# Patient Record
Sex: Female | Born: 1955 | Race: White | Hispanic: No | Marital: Married | Smoking: Never smoker
Health system: Southern US, Community
[De-identification: ages and names within clinical notes are randomized; demographics above are authoritative.]

## PROBLEM LIST (undated history)

## (undated) DIAGNOSIS — E039 Hypothyroidism, unspecified: Secondary | ICD-10-CM

## (undated) HISTORY — DX: Hypothyroidism, unspecified: E03.9

---

## 2007-10-09 IMAGING — CT CT ABD-PELV W/O CM
3 of 9 series · 11 of 42 positions shown, 17 images · non-contrast
Comparison: NONE

CLINICAL DATA: Severe lower abdominal pain off and on times 1 
week.  

CT ABDOMEN AND PELVIS WITHOUT AND WITH INTRAVENOUS AND FOLLOWING 
ORAL  CONTRAST
TECHNIQUE: Multiple axial 5.0-mm-thick slices at 5.0-mm 
intervals were obtained from the lung base through the pelvis 
following the intravenous administration of 96 cc of Optiray 350 
at a rate of 3 cc per second.  Oral contrast was administered as 
well.  Arterial and venous phase imaging was obtained in the upper 
abdomen with delayed images obtained through the pelvis.

[Series 4: venous · axial · portal-venous · 0.68mm/px · z∈[-176,+94]mm · 4 of 90 slices shown, 9 images]
[im 18/90  soft-tissue]
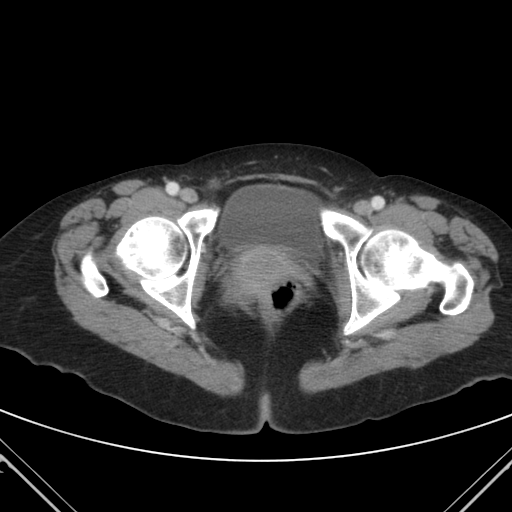
[im 18/90  lung]
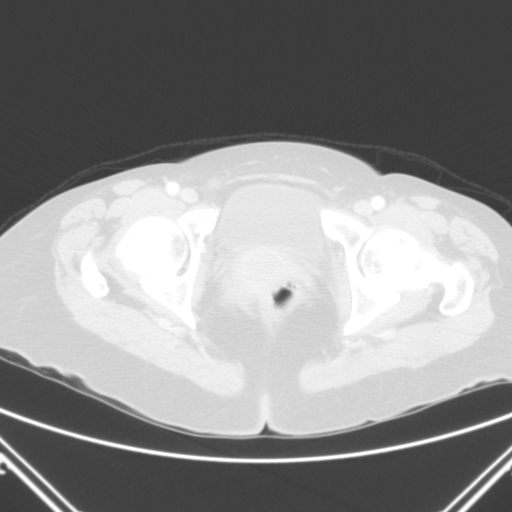
[im 18/90  bone]
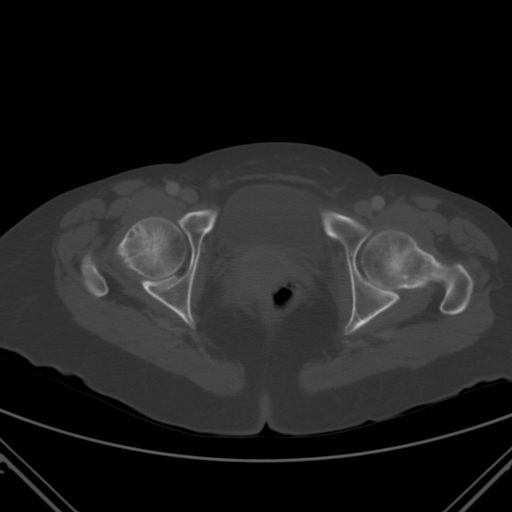
[im 36/90  soft-tissue]
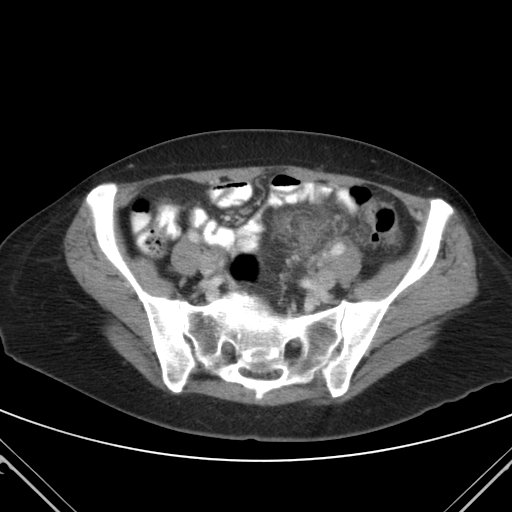
[im 36/90  lung]
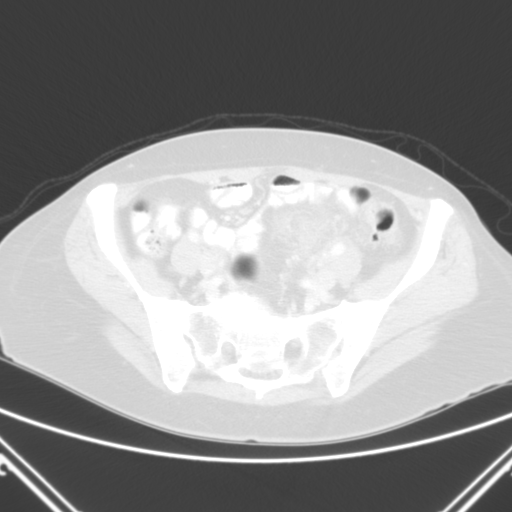
[im 54/90  soft-tissue]
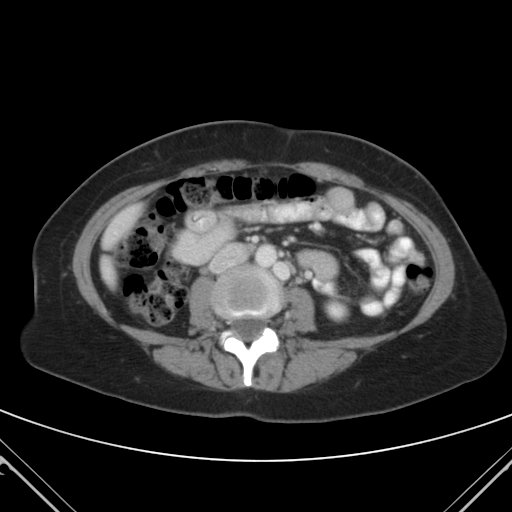
[im 54/90  lung]
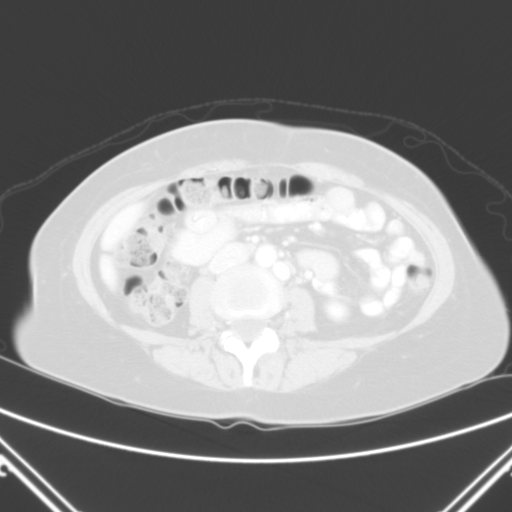
[im 72/90  soft-tissue]
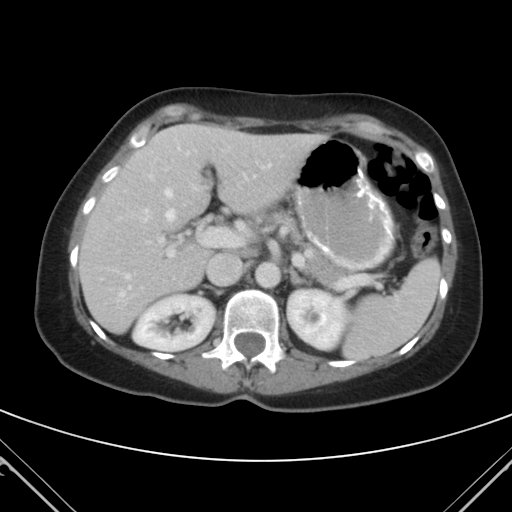
[im 72/90  lung]
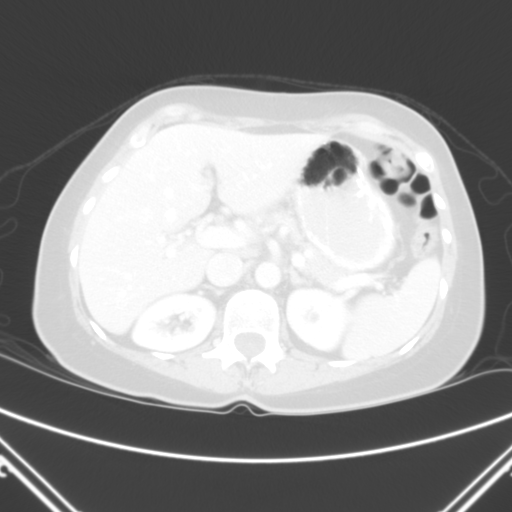

[measurment · axial · 0.68mm/px · z∈[-176,+94]mm · 4 of 90 slices shown]
[im 18/90  soft-tissue]
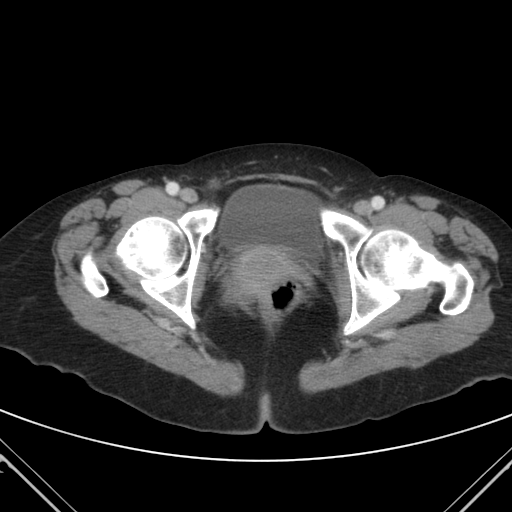
[im 36/90  soft-tissue]
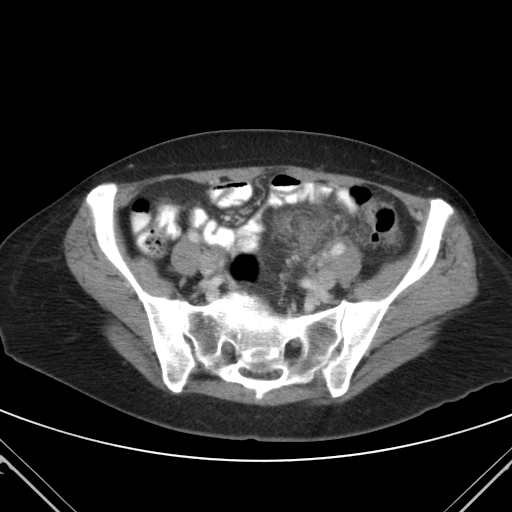
[im 54/90  soft-tissue]
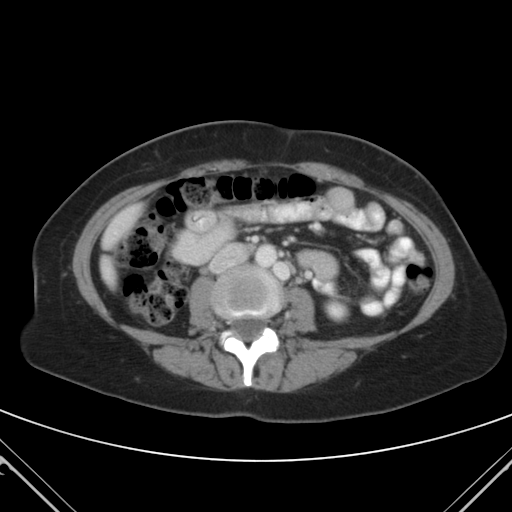
[im 72/90  soft-tissue]
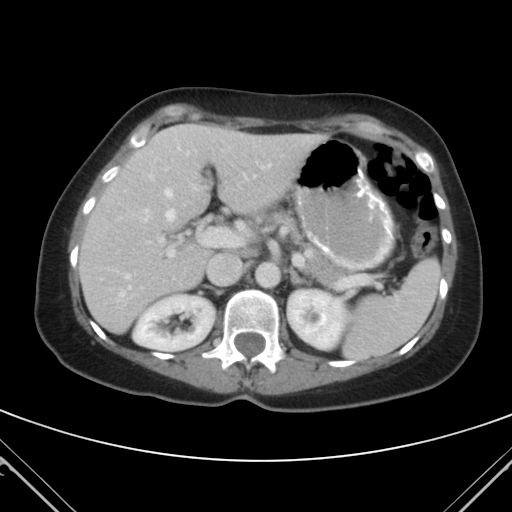

[coronals · coronal · 0.87mm/px · 3 of 58 slices shown, 4 images]
[im 20/58  soft-tissue]
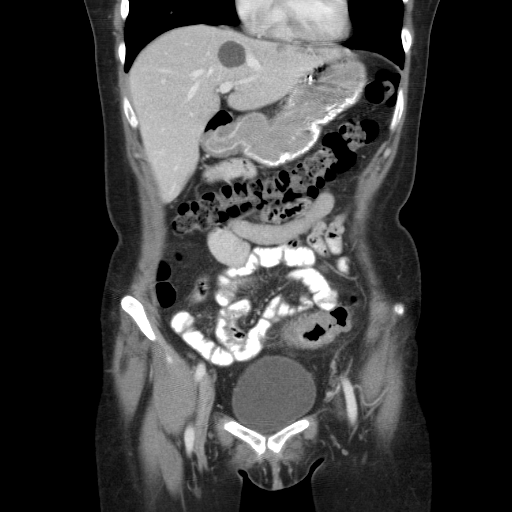
[im 26/58  soft-tissue]
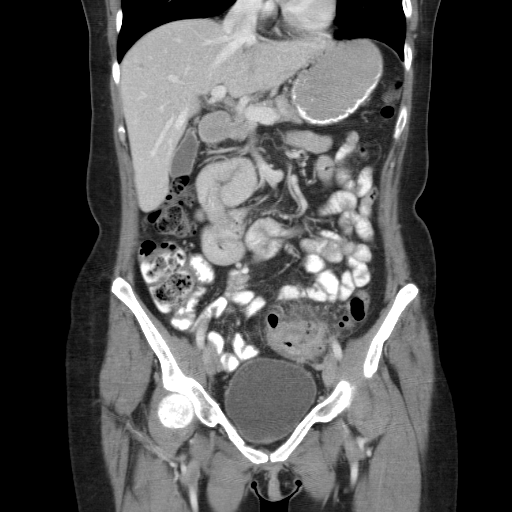
[im 26/58  bone]
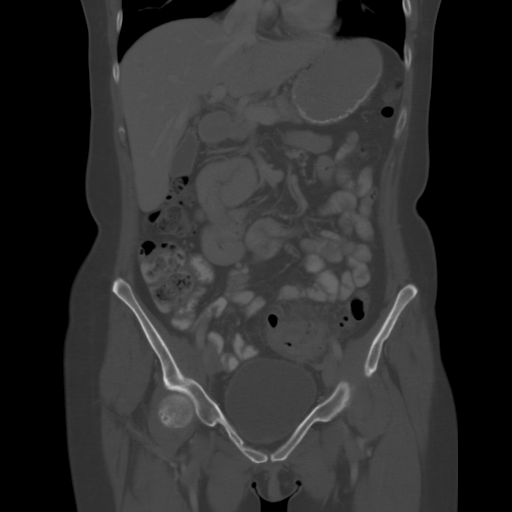
[im 32/58  soft-tissue]
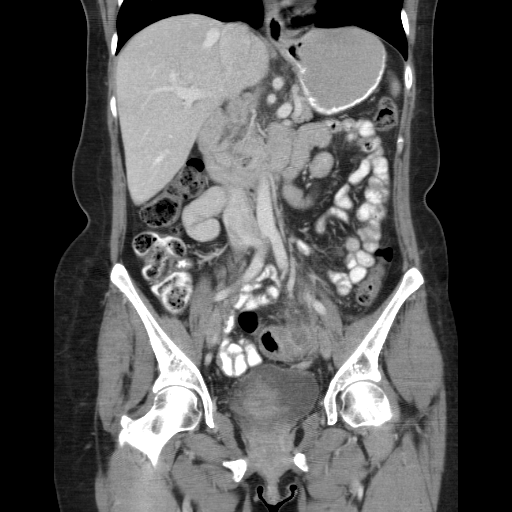

[11 of 42 positions shown; findings below may reference images not displayed]

FINDINGS: Scout image shows normal bowel gas pattern. The initial 
noncontrast images show a well-circumscribed, low-attenuation 
lesion in the dome of the right lobe of the liver, most likely due 
to a liver cyst.  Oral contrast is noted within the gastric lumen. 
 The spleen, pancreas, and gallbladder are normal.  Left kidney is 
normal.  Patient has some prominence of the right renal pelvis.  
Ureters are normal in caliber. Following IV contrast 
administration, the lesion in the right lobe of the liver does not 
enhance and a second tiny lesion in the posterior right lobe of 
the liver is also noted, consistent with additional liver cyst.  
Pancreas enhances normally.  Renal parenchyma enhances well 
bilaterally.  Patient has a retroaortic draining left renal vein, 
which is a normal vascular variant. On the delayed post-contrast 
images, several small renal cortical cysts are noted on the left. 
Pericecal structures are normal. Patient has abnormal wall 
thickening and pericolonic stranding and inflammatory changes 
adjacent to the sigmoid colon, worrisome for acute diverticulitis. 
Patient has a heterogeneous enhancement pattern of the uterine 
myometrium.  Urinary bladder contour is smooth.  There is some 
free fluid in the right cul-de-sac.  Small cyst or follicle is 
noted in the left ovary.  Lung bases are clear.
IMPRESSION: Changes of diverticulitis sigmoid colon with small 
collection of fluid in the right cul-de-sac of the pelvis.  
Patient has benign cysts within the liver and left kidney. This 
report was called to [REDACTED] on 10/29/2006. 
Klever Jumper, M.D. Electronically reviewed on 10/29/2006 Dict 
Date: 10/29/2006  Trans Date: 10/29/2006 [REDACTED]

## 2016-01-06 ENCOUNTER — Other Ambulatory Visit (HOSPITAL_COMMUNITY)
Admission: RE | Admit: 2016-01-06 | Discharge: 2016-01-06 | Disposition: A | Discharge status: Home / Self Care | Payer: 59 | Source: Ambulatory Visit | Attending: Family Medicine | Admitting: Family Medicine

## 2016-01-06 DIAGNOSIS — Z1151 Encounter for screening for human papillomavirus (HPV): Secondary | ICD-10-CM | POA: Diagnosis not present

## 2016-01-06 DIAGNOSIS — Z01411 Encounter for gynecological examination (general) (routine) with abnormal findings: Secondary | ICD-10-CM | POA: Diagnosis present

## 2016-01-08 ENCOUNTER — Other Ambulatory Visit: Payer: Self-pay | Admitting: Family Medicine

## 2016-01-14 LAB — CYTOLOGY - PAP
Diagnosis: NEGATIVE
HPV: NOT DETECTED

## 2016-01-24 ENCOUNTER — Other Ambulatory Visit: Payer: Self-pay | Admitting: Family Medicine

## 2016-01-24 DIAGNOSIS — R7989 Other specified abnormal findings of blood chemistry: Secondary | ICD-10-CM

## 2016-01-30 ENCOUNTER — Ambulatory Visit
Admission: RE | Admit: 2016-01-30 | Discharge: 2016-01-30 | Disposition: A | Discharge status: Home / Self Care | Payer: 59 | Source: Ambulatory Visit | Attending: Family Medicine | Admitting: Family Medicine

## 2016-01-30 DIAGNOSIS — R7989 Other specified abnormal findings of blood chemistry: Secondary | ICD-10-CM

## 2016-12-29 ENCOUNTER — Other Ambulatory Visit: Payer: Self-pay | Admitting: Family Medicine

## 2016-12-29 DIAGNOSIS — E039 Hypothyroidism, unspecified: Secondary | ICD-10-CM

## 2017-01-05 ENCOUNTER — Ambulatory Visit
Admission: RE | Admit: 2017-01-05 | Discharge: 2017-01-05 | Disposition: A | Discharge status: Home / Self Care | Payer: BLUE CROSS/BLUE SHIELD | Source: Ambulatory Visit | Attending: Family Medicine | Admitting: Family Medicine

## 2017-01-05 DIAGNOSIS — E039 Hypothyroidism, unspecified: Secondary | ICD-10-CM

## 2017-01-09 IMAGING — US US THYROID
1 series · 13 of 25 positions shown · non-contrast
Comparison: None.

CLINICAL DATA: Abnormal TSH by thyroid function tests

EXAM:
THYROID ULTRASOUND
TECHNIQUE: Ultrasound examination of the thyroid gland and adjacent soft
tissues was performed.

[Series 1: us thyroid · 0.08mm/px · 13 of 61 slices shown]
[im 1/61]
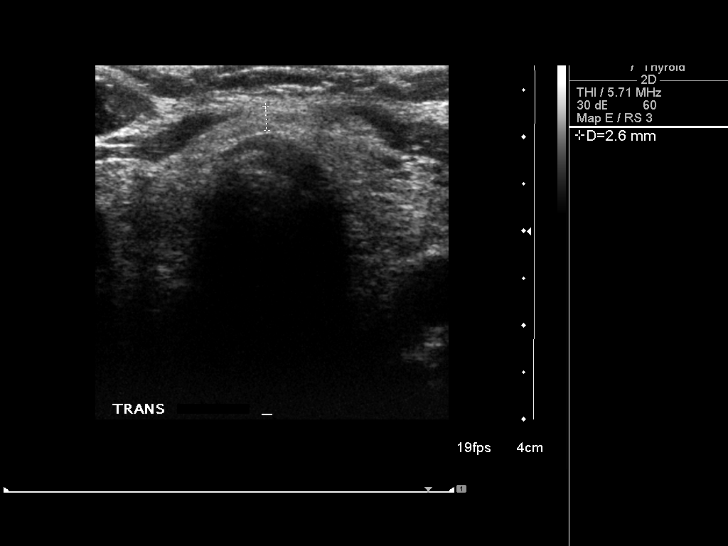
[im 6/61]
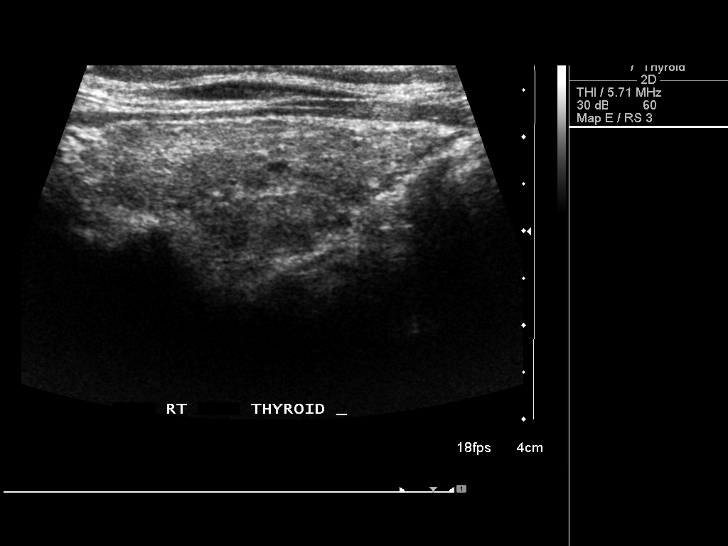
[im 11/61]
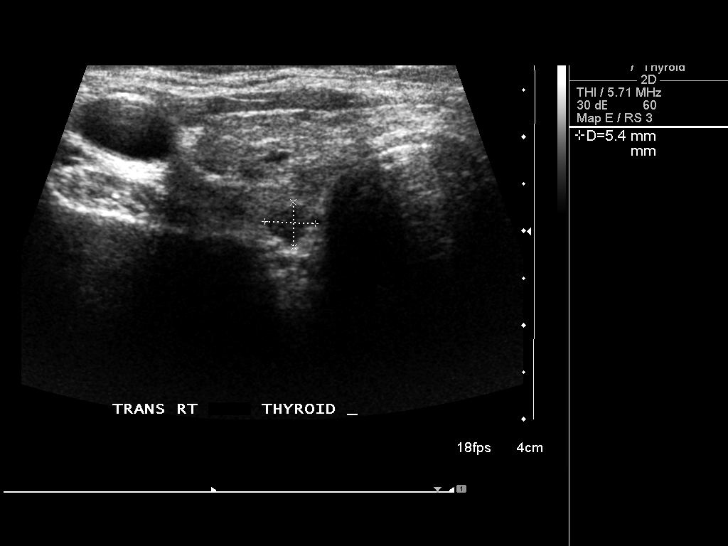
[im 16/61]
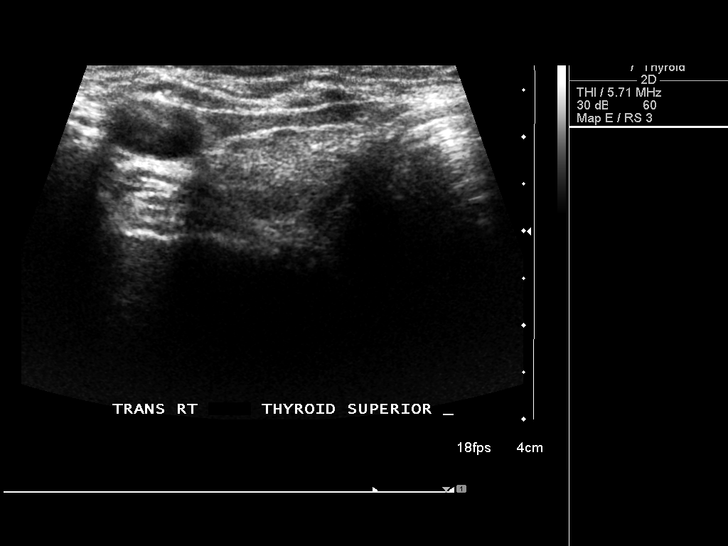
[im 21/61]
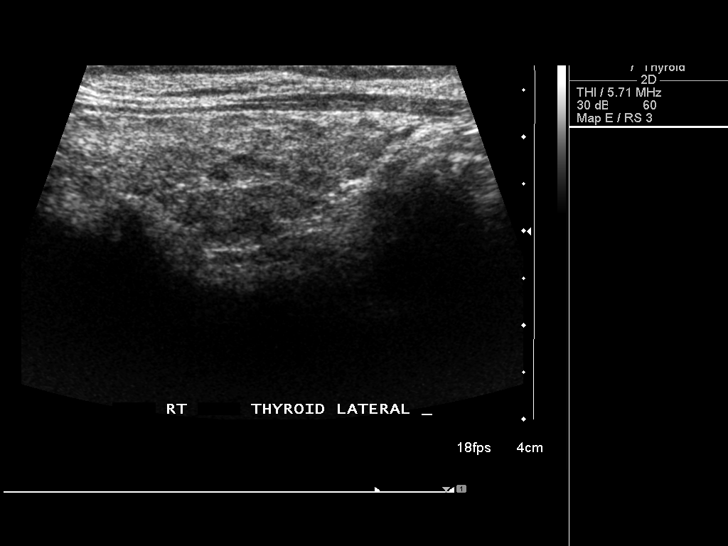
[im 26/61]
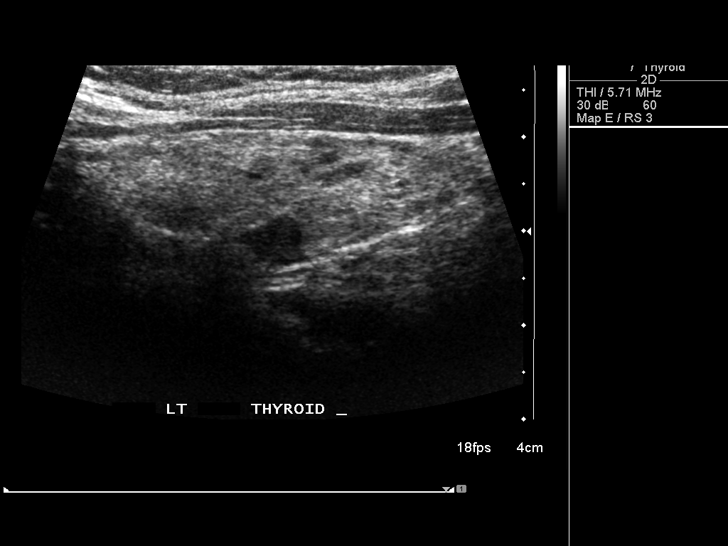
[im 31/61]
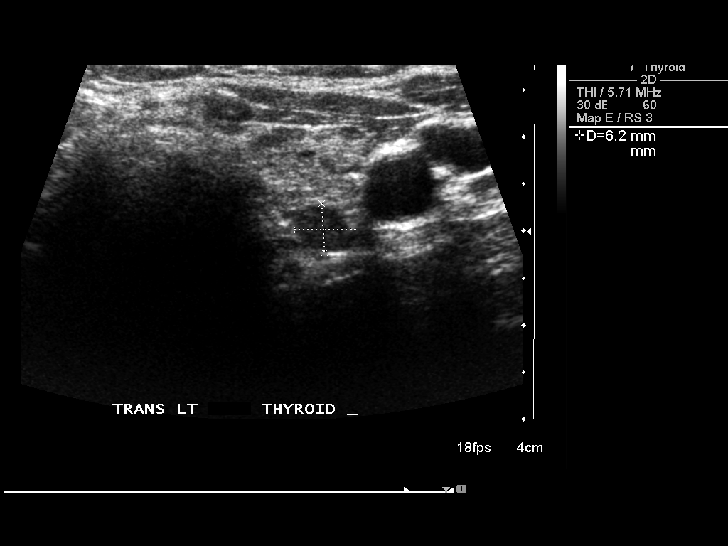
[im 36/61]
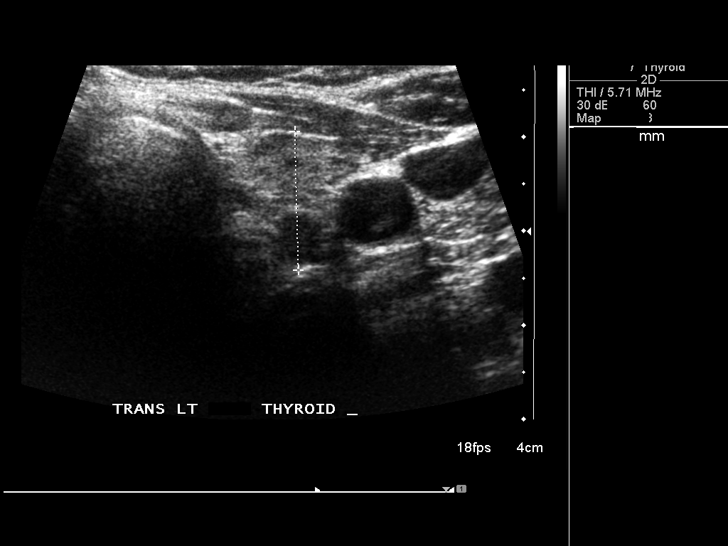
[im 41/61]
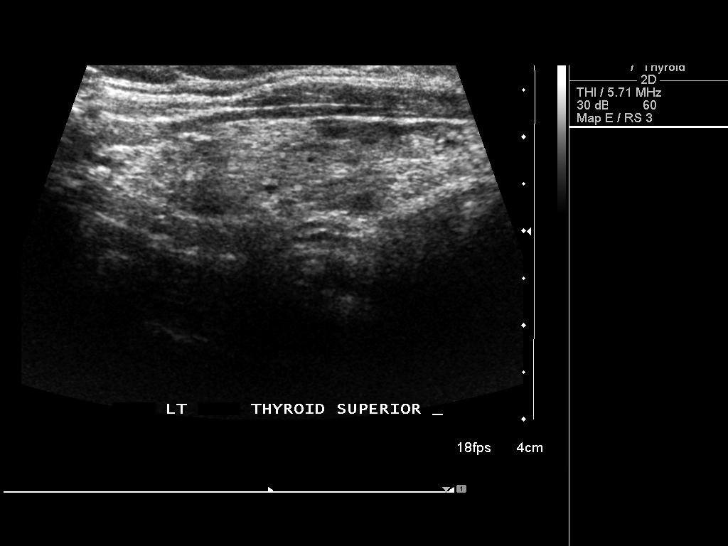
[im 46/61]
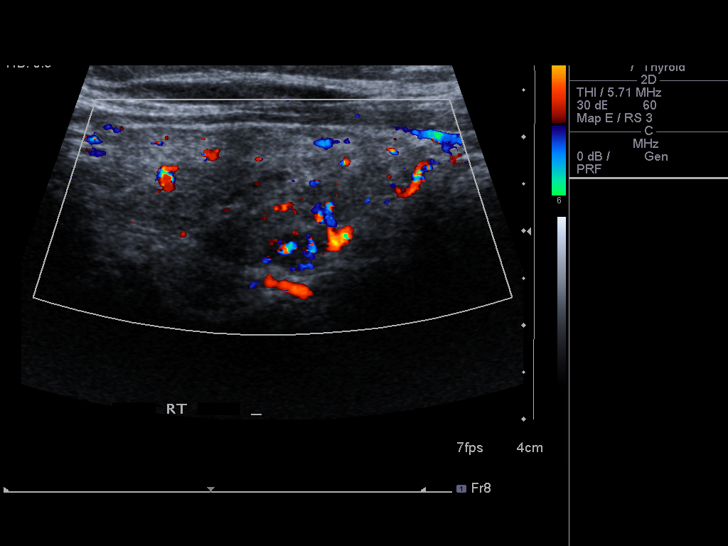
[im 51/61]
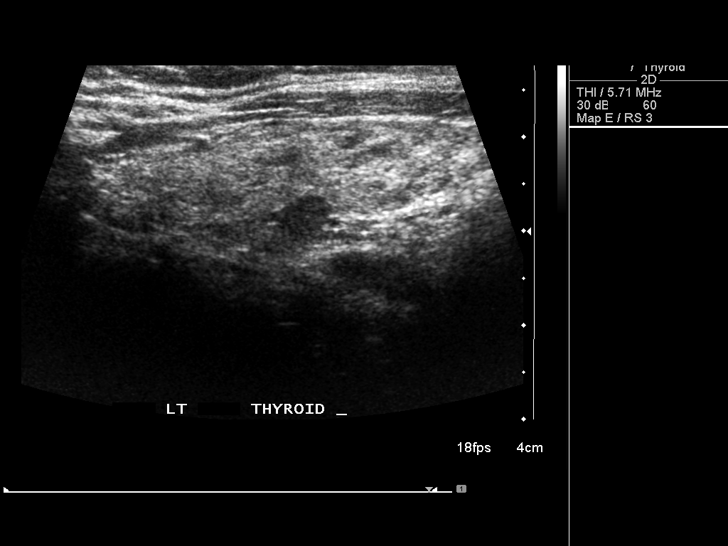
[im 56/61]
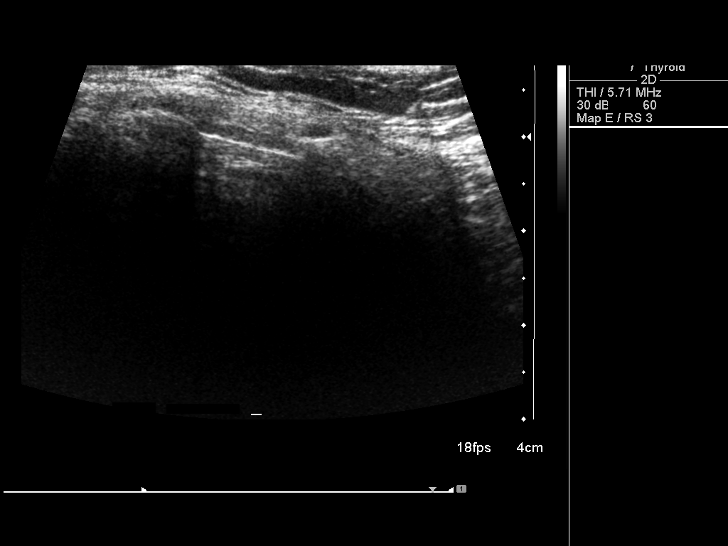
[im 61/61]
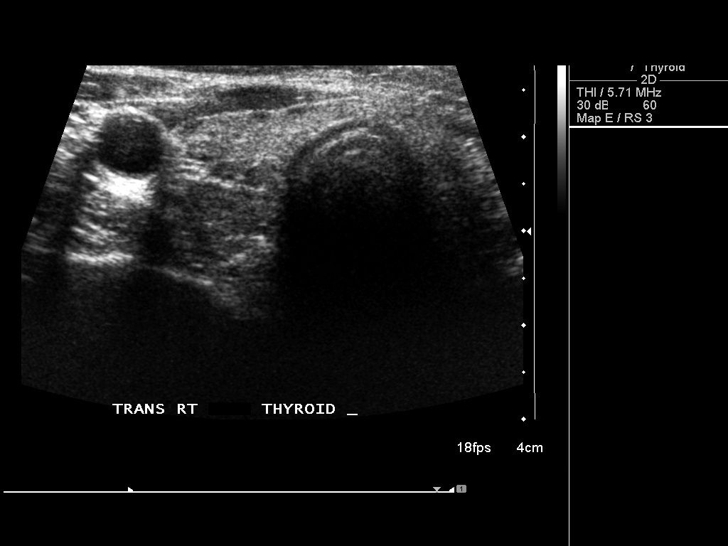

[13 of 25 positions shown; findings below may reference images not displayed]

FINDINGS: Parenchymal Echotexture: Moderately heterogenous

Estimated total number of nodules >/= 1 cm: 0

Number of spongiform nodules >/=  2 cm not described below (TR1): 0

Number of mixed cystic and solid nodules >/= 1.5 cm not described
below (TR2): 0

_________________________________________________________

Isthmus: 3 mm

Nodule # 1:

Location: Isthmus; Mid

Size: 6 x 3 x 5 mm

Composition: solid/almost completely solid (2)

Echogenicity: hypoechoic (2)

Shape: not taller-than-wide (0)

Margins: ill-defined (0)

Echogenic foci: none (0)

ACR TI-RADS total points: 4.

ACR TI-RADS risk category: TR4 (4-6 points).

ACR TI-RADS recommendations:

Given size (<0.9 cm) and appearance, this nodule does NOT meet
TI-RADS criteria for biopsy or dedicated follow-up.

_________________________________________________________

Right lobe: 4.1 x 1.5 x 1.7 cm

7 x 5 x 5 mm solid hypoechoic right midpole nodule medially, also
compatible with a less than 9 mm TR 4 nodule. This nodule also does
not meet criteria for biopsy or dedicated follow-up at this size.

_________________________________________________________

Left lobe: 4.5 x 1.5 x 1.7 cm

8 x 5 x 6 mm solid hypoechoic left midpole nodule, also compatible
with a less than 9 mm TR 4 nodule. Therefore, this nodule also does
not meet criteria for biopsy or additional follow-up.
IMPRESSION: Subcentimeter hypoechoic solid nodules bilaterally (TR 4 nodules).
These all are less than 9 mm and did not meet criteria for biopsy or
additional follow-up.

The above is in keeping with the ACR TI-RADS recommendations - [HOSPITAL] 0590;[DATE].

## 2017-01-12 ENCOUNTER — Other Ambulatory Visit: Payer: 59

## 2018-01-17 ENCOUNTER — Other Ambulatory Visit: Payer: Self-pay | Admitting: Family Medicine

## 2018-01-17 DIAGNOSIS — E041 Nontoxic single thyroid nodule: Secondary | ICD-10-CM

## 2018-01-19 ENCOUNTER — Ambulatory Visit
Admission: RE | Admit: 2018-01-19 | Discharge: 2018-01-19 | Disposition: A | Discharge status: Home / Self Care | Payer: BLUE CROSS/BLUE SHIELD | Source: Ambulatory Visit | Attending: Family Medicine | Admitting: Family Medicine

## 2018-01-19 DIAGNOSIS — E041 Nontoxic single thyroid nodule: Secondary | ICD-10-CM

## 2019-01-01 IMAGING — US US THYROID
1 series · 13 of 25 positions shown · non-contrast
Comparison: 01/30/2016

CLINICAL DATA: Hypothyroid.

EXAM:
THYROID ULTRASOUND
TECHNIQUE: Ultrasound examination of the thyroid gland and adjacent soft
tissues was performed.

[Series 1: us thyroid · 0.05mm/px · 13 of 53 slices shown]
[im 1/53]
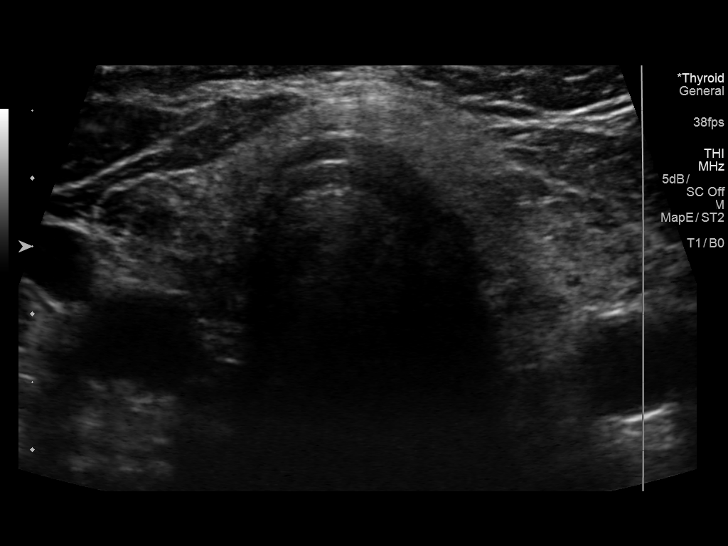
[im 5/53]
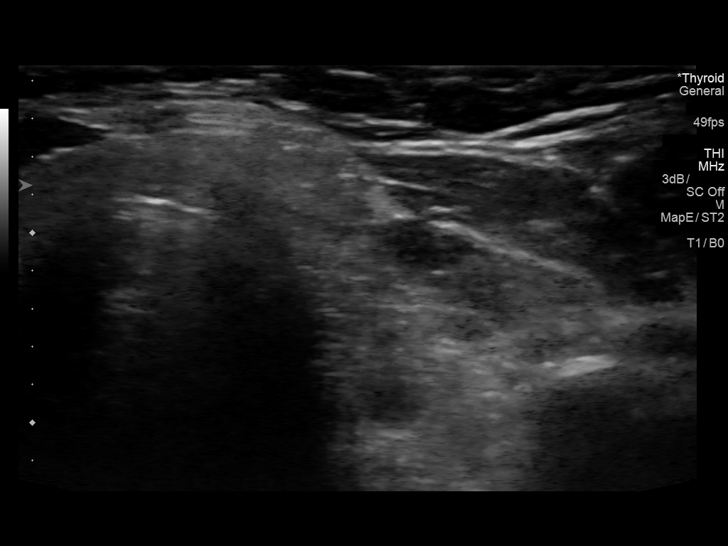
[im 9/53]
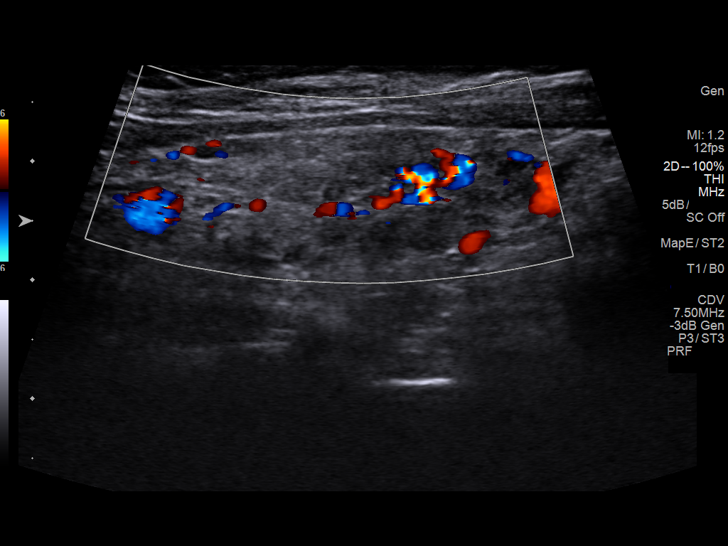
[im 14/53]
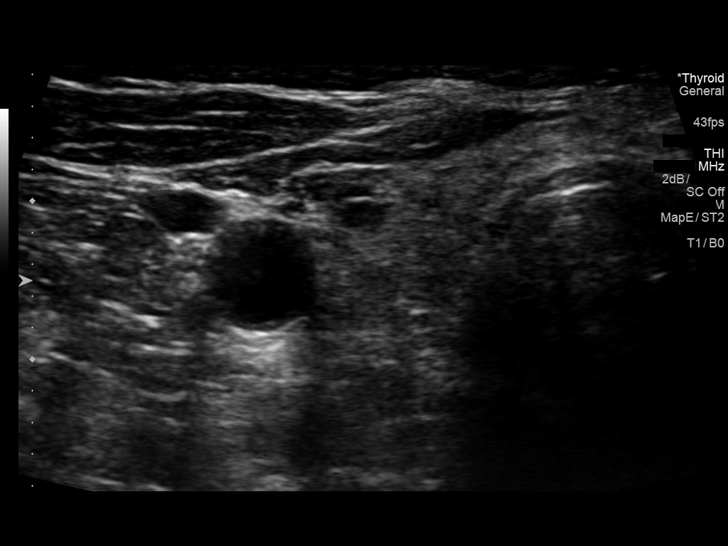
[im 18/53]
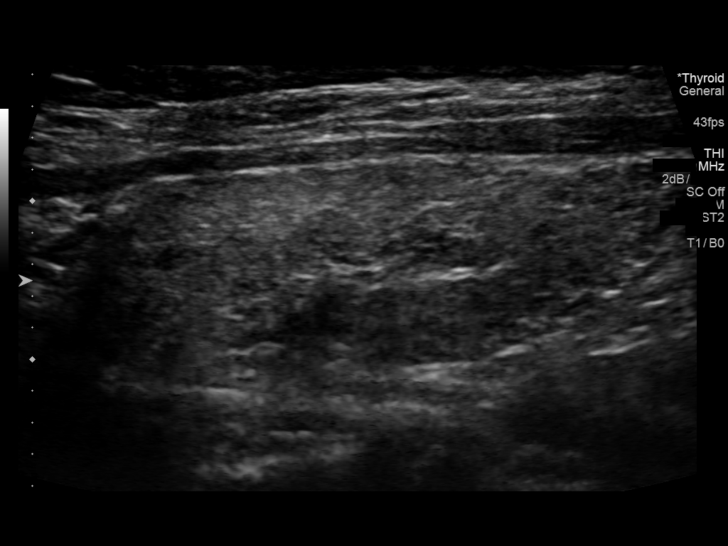
[im 22/53]
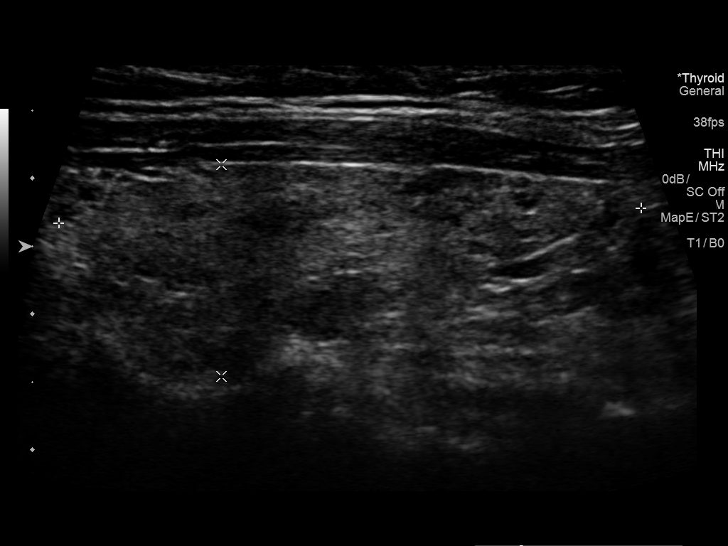
[im 27/53]
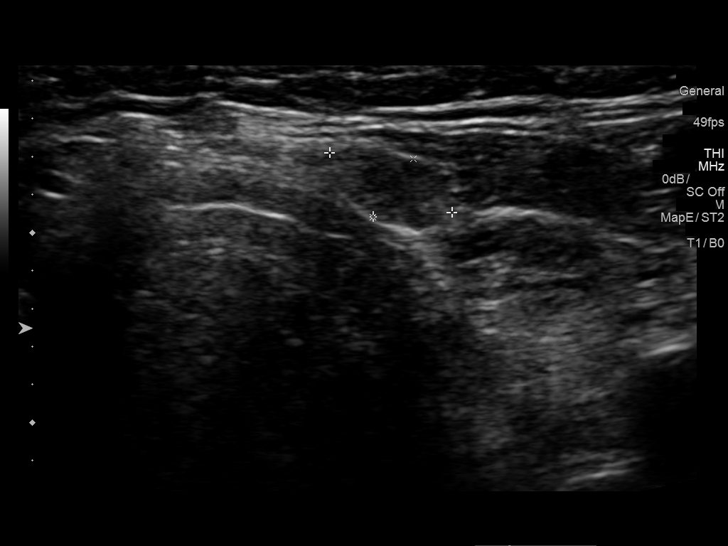
[im 31/53]
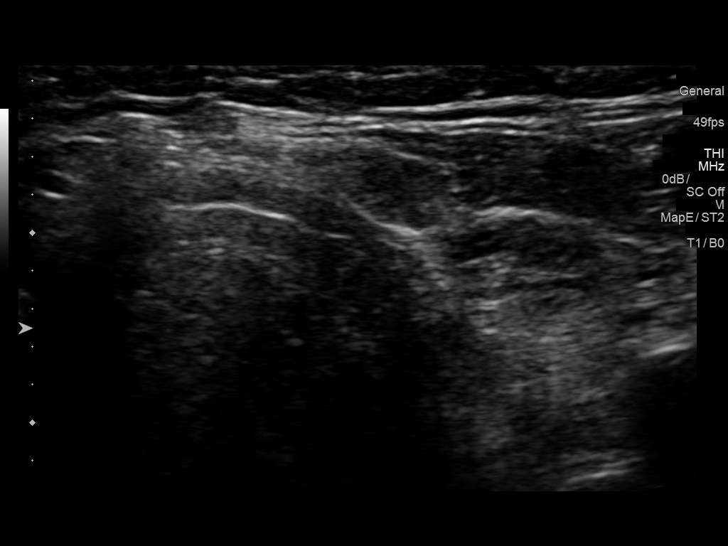
[im 35/53]
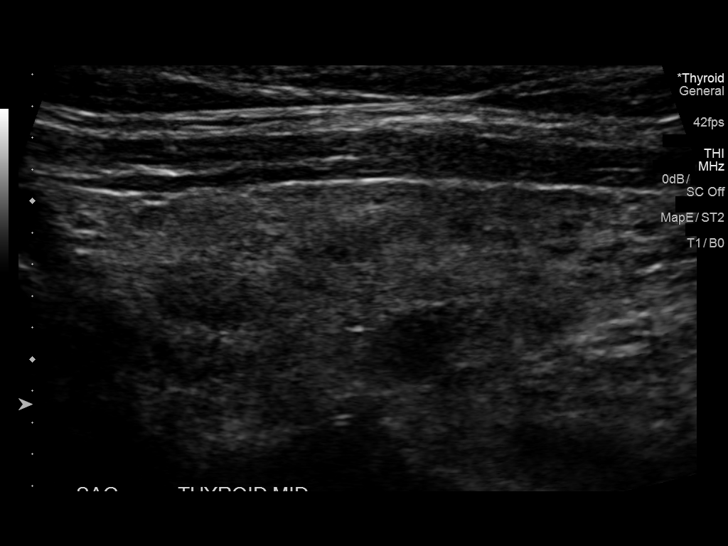
[im 40/53]
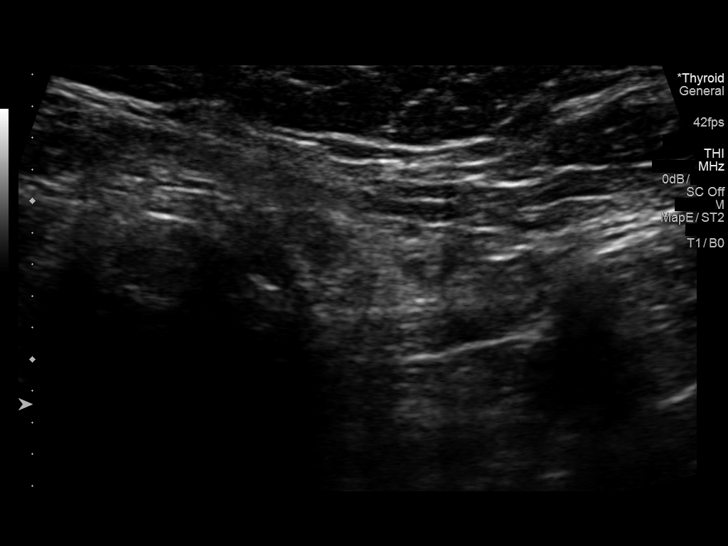
[im 44/53]
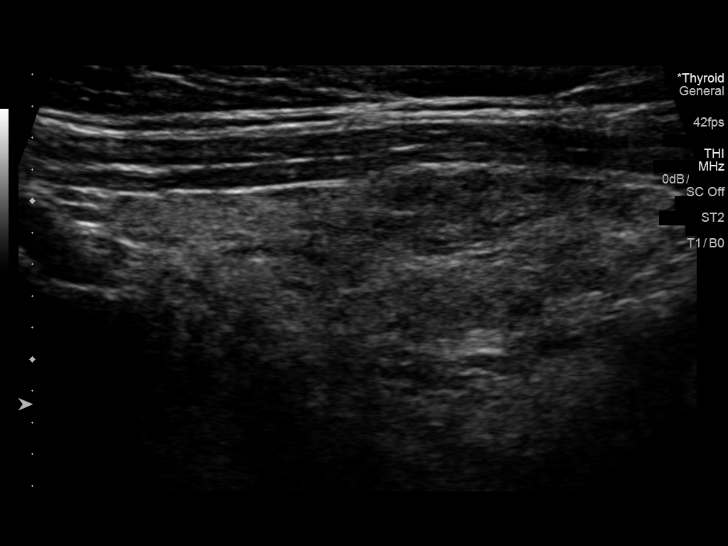
[im 48/53]
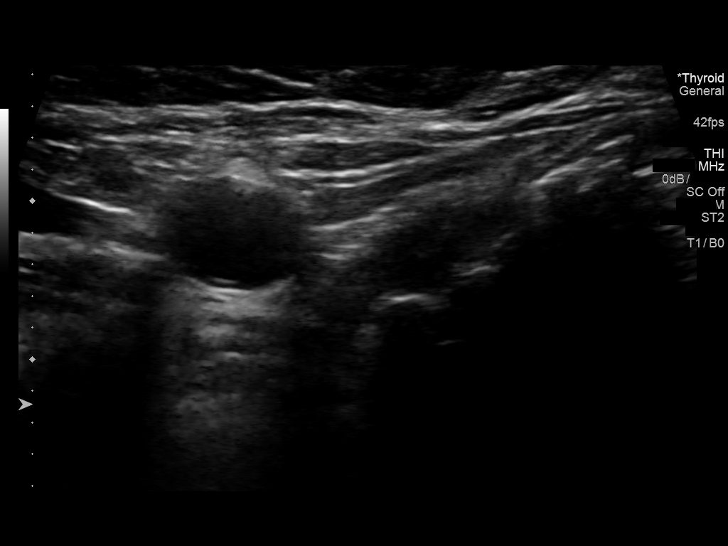
[im 53/53]
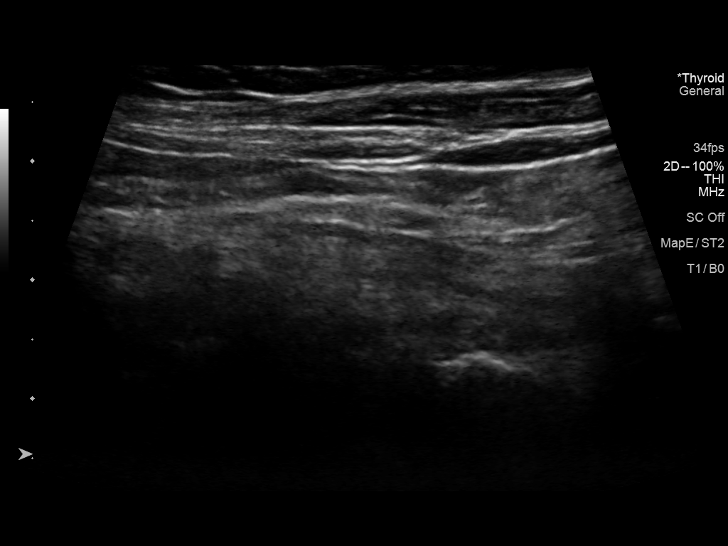

[13 of 25 positions shown; findings below may reference images not displayed]

FINDINGS: Parenchymal Echotexture: Moderately heterogenous

Isthmus: Normal in size measures 0.3 cm in diameter, unchanged

Right lobe: Normal in size measuring 4.7 x 1.3 x 1.4 cm, unchanged,
previously, 4.1 x 1.5 x 1.7 cm

Left lobe: Normal in size measuring 4.3 x 1.6 x 1.5 cm, unchanged,
previously, 4.5 x 1.5 x 1.7 cm

_________________________________________________________

Estimated total number of nodules >/= 1 cm: 1

Number of spongiform nodules >/=  2 cm not described below (TR1): 0

Number of mixed cystic and solid nodules >/= 1.5 cm not described
below (TR2): 0

_________________________________________________________

Nodule # 1:

Location: Left; Mid

Maximum size: 1.1 cm; Other 2 dimensions: 0.7 x 0.4 cm - this nodule
is not definitely seen on the prior examination.

Composition: solid/almost completely solid (2)

Echogenicity: hypoechoic (2)

Shape: not taller-than-wide (0)

Margins: ill-defined (0)

Echogenic foci: none (0)

ACR TI-RADS total points: 4.

ACR TI-RADS risk category: TR4 (4-6 points).

ACR TI-RADS recommendations:

*Given size (>/= 1 - 1.4 cm) and appearance, a follow-up ultrasound
in 1 year should be considered based on TI-RADS criteria.

_________________________________________________________

There is a punctate (approximately 0.5 cm) anechoic cyst within the
mid, posterior aspect of the left lobe of the thyroid which is
unchanged compared to the [DATE] examination, previously, 0.6 cm,
and again does not meet imaging criteria to recommend percutaneous
sampling or continued dedicated follow-up.
IMPRESSION: 1. Findings suggestive of multinodular goiter.
2. Nodule #1 within the left lobe of the thyroid, not definitely
seen on the [DATE] examination, meets imaging criteria to recommend
a 1 year follow-up as clinically indicated.

The above is in keeping with the ACR TI-RADS recommendations - [HOSPITAL] 8971;[DATE].

## 2019-09-15 DIAGNOSIS — R69 Illness, unspecified: Secondary | ICD-10-CM | POA: Diagnosis not present

## 2019-09-15 DIAGNOSIS — L908 Other atrophic disorders of skin: Secondary | ICD-10-CM | POA: Diagnosis not present

## 2019-09-15 DIAGNOSIS — E041 Nontoxic single thyroid nodule: Secondary | ICD-10-CM | POA: Diagnosis not present

## 2019-09-15 DIAGNOSIS — Z0000 Encounter for general adult medical examination without abnormal findings: Secondary | ICD-10-CM | POA: Diagnosis not present

## 2019-09-15 DIAGNOSIS — E039 Hypothyroidism, unspecified: Secondary | ICD-10-CM | POA: Diagnosis not present

## 2019-09-15 DIAGNOSIS — J342 Deviated nasal septum: Secondary | ICD-10-CM | POA: Diagnosis not present

## 2019-09-15 DIAGNOSIS — E559 Vitamin D deficiency, unspecified: Secondary | ICD-10-CM | POA: Diagnosis not present

## 2019-09-15 DIAGNOSIS — Z79899 Other long term (current) drug therapy: Secondary | ICD-10-CM | POA: Diagnosis not present

## 2019-10-11 DIAGNOSIS — Z23 Encounter for immunization: Secondary | ICD-10-CM | POA: Diagnosis not present

## 2019-11-25 ENCOUNTER — Other Ambulatory Visit: Payer: Self-pay

## 2019-11-25 ENCOUNTER — Ambulatory Visit: Payer: Medicare HMO | Attending: Internal Medicine

## 2019-11-25 DIAGNOSIS — Z23 Encounter for immunization: Secondary | ICD-10-CM

## 2019-11-25 NOTE — Progress Notes (Signed)
   Covid-19 Vaccination Clinic  Name:  Shannon Sims    MRN: [id number] DOB: 07-26-1955  11/25/2019  Shannon Sims was observed post Covid-19 immunization for 15 minutes without incident. She was provided with Vaccine Information Sheet and instruction to access the V-Safe system.   Shannon Sims was instructed to call 911 with any severe reactions post vaccine: Marland Kitchen Difficulty breathing  . Swelling of face and throat  . A fast heartbeat  . A bad rash all over body  . Dizziness and weakness

## 2020-01-15 IMAGING — US US THYROID
1 series · 13 of 25 positions shown · non-contrast
Comparison: 01/05/2017; 01/30/2016

CLINICAL DATA: Prior ultrasound follow-up. Follow-up thyroid nodule

EXAM:
THYROID ULTRASOUND
TECHNIQUE: Ultrasound examination of the thyroid gland and adjacent soft
tissues was performed.

[Series 1: us thyroid · 0.05mm/px · 13 of 42 slices shown]
[im 1/42]
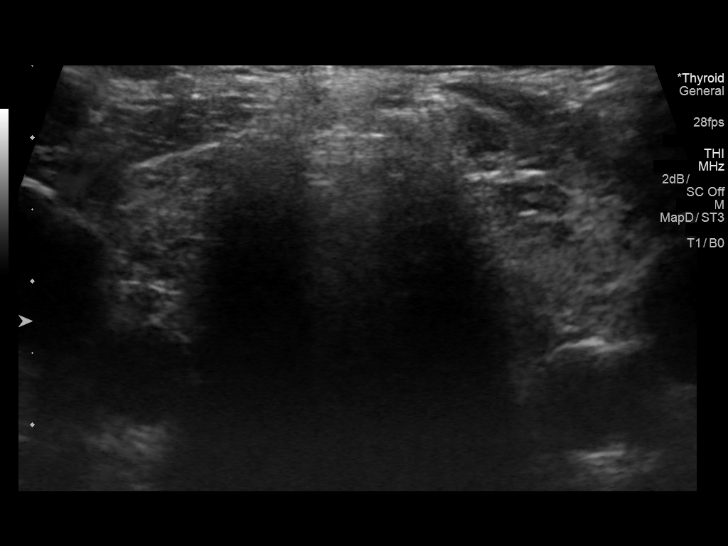
[im 4/42]
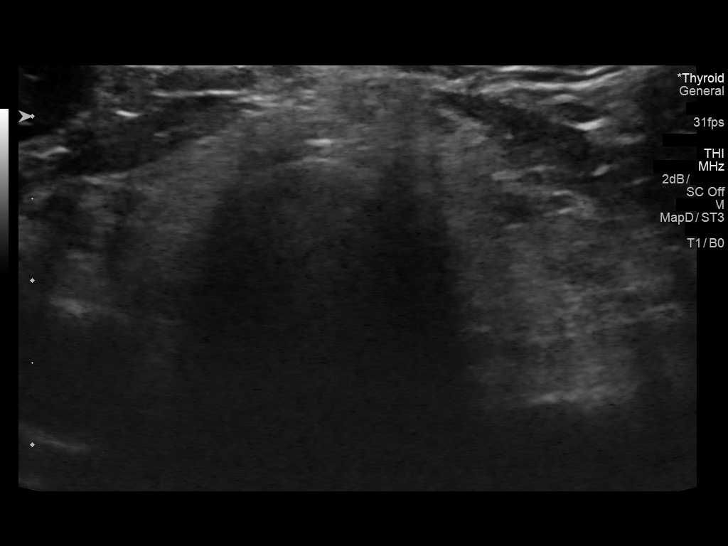
[im 7/42]
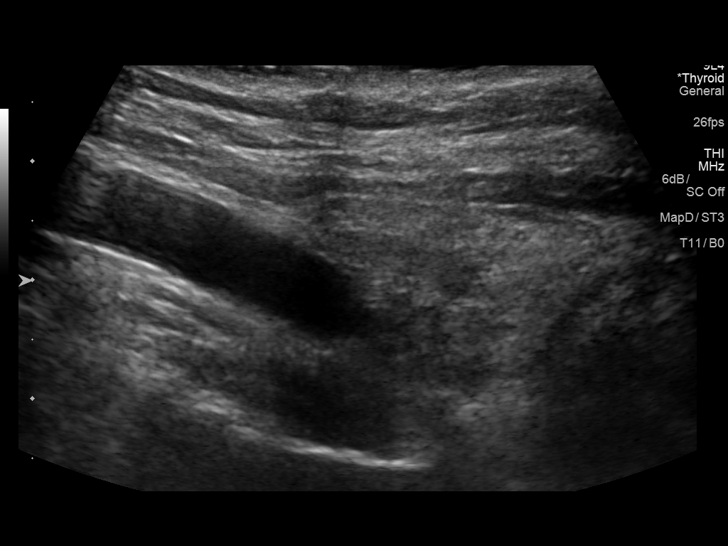
[im 11/42]
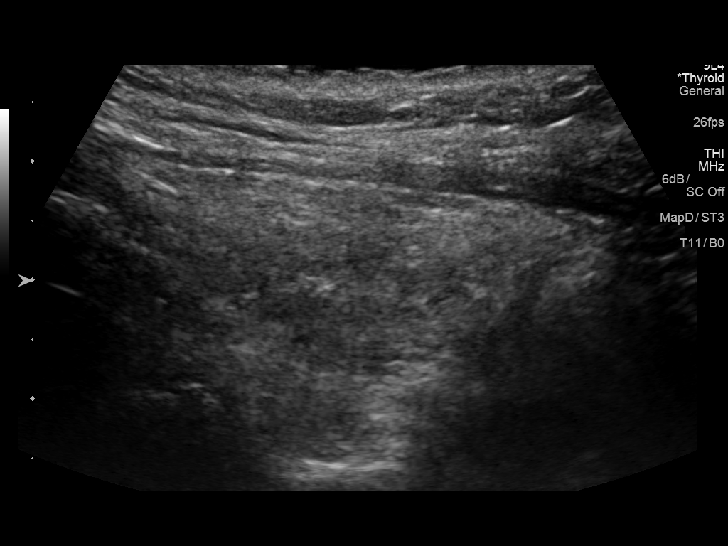
[im 14/42]
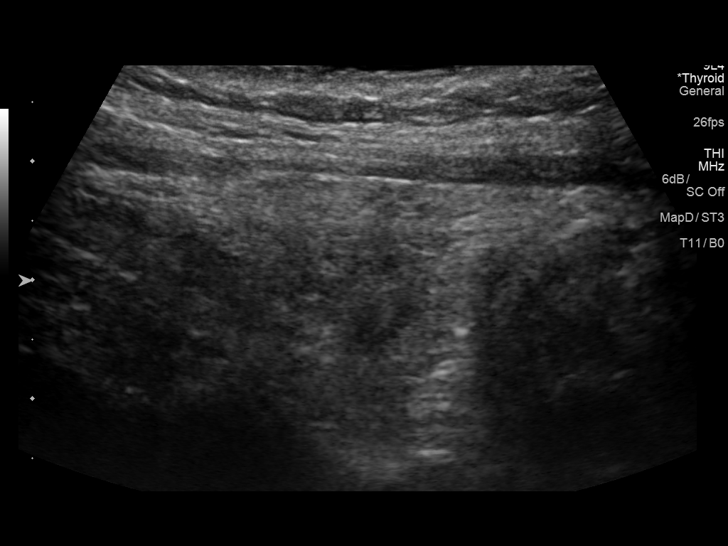
[im 18/42]
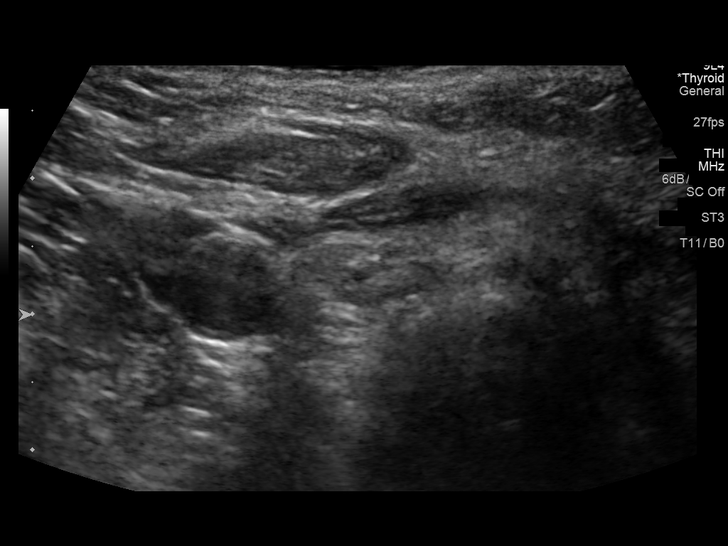
[im 21/42]
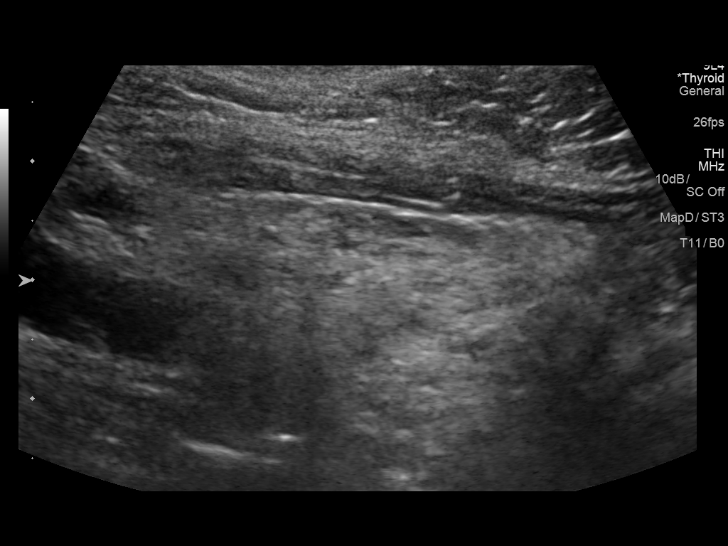
[im 24/42]
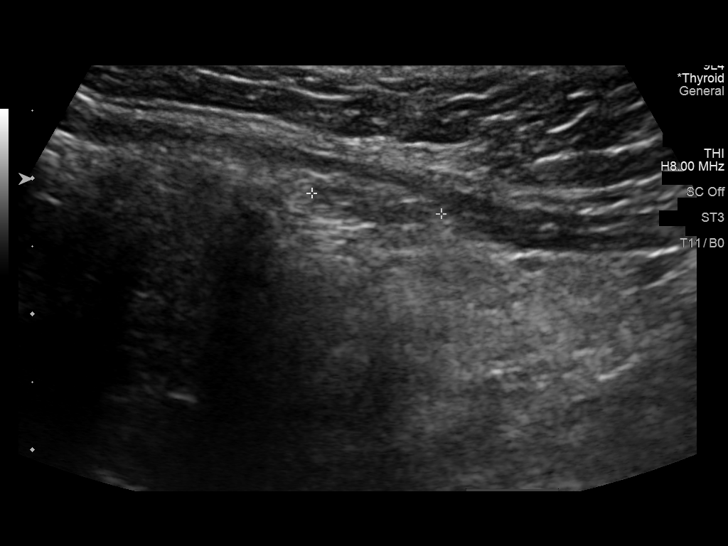
[im 28/42]
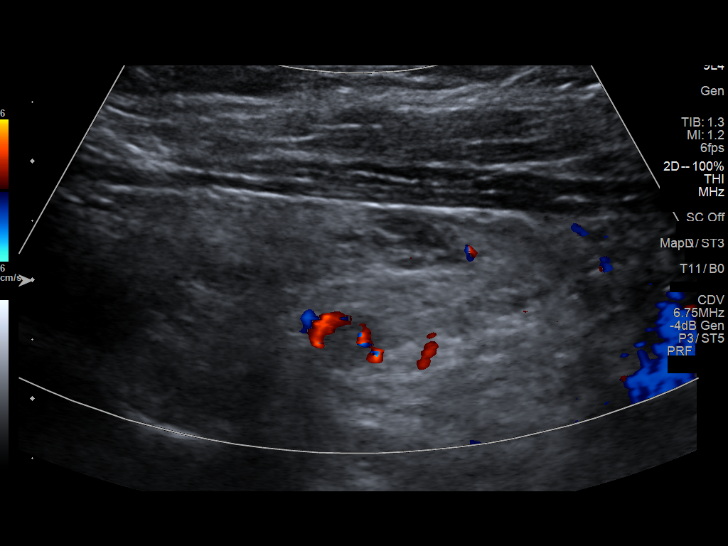
[im 31/42]
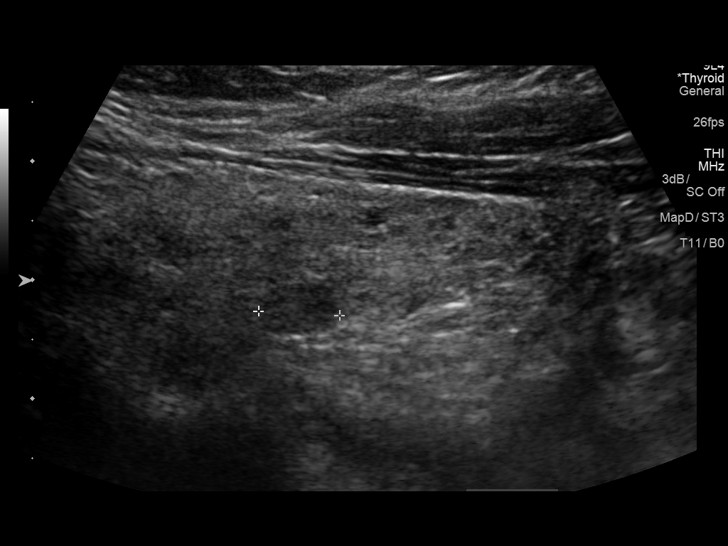
[im 35/42]
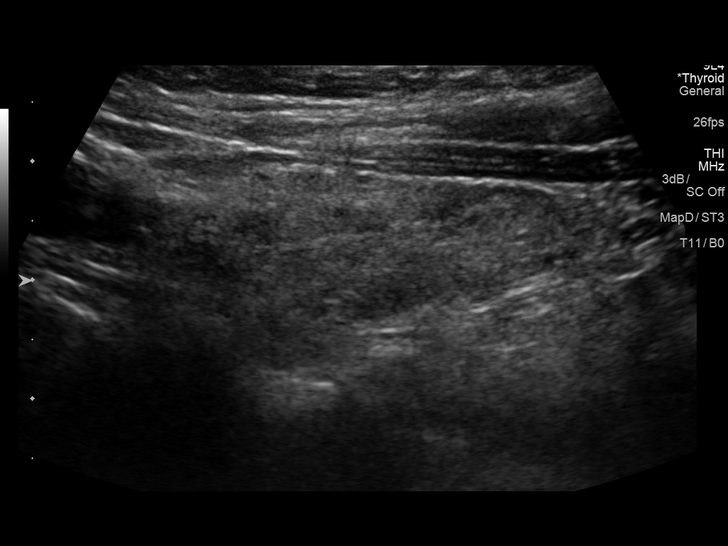
[im 38/42]
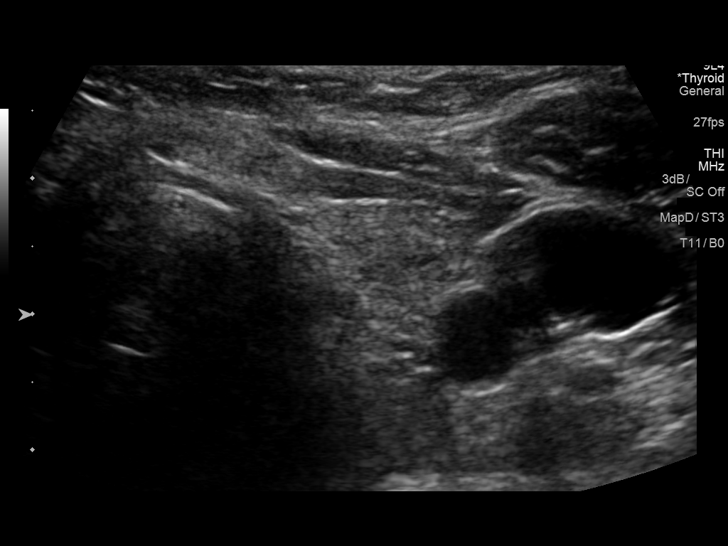
[im 42/42]
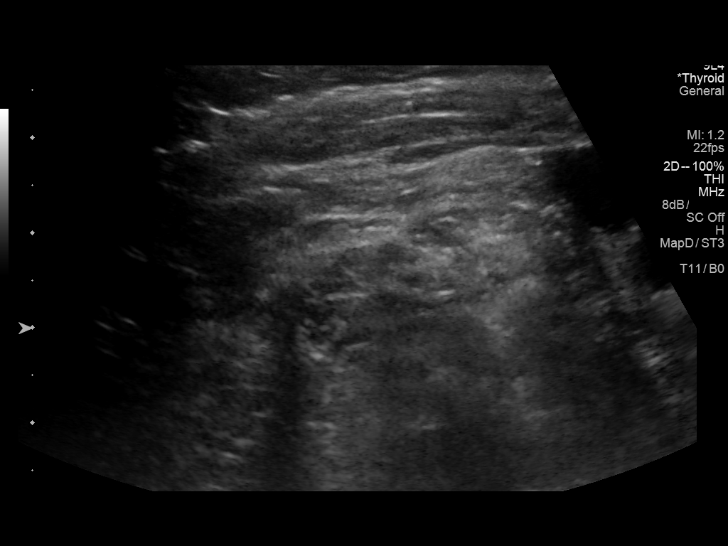

[13 of 25 positions shown; findings below may reference images not displayed]

FINDINGS: Parenchymal Echotexture: Moderately heterogenous

Isthmus: Normal in size measuring 0.3 cm in diameter, unchanged

Right lobe: Slightly atrophic in size measuring 4.9 x 1.6 x 1.2 cm,
unchanged, previously, 4.7 x 1.3 x 1.4 cm

Left lobe: Slightly atrophic in size measuring 4.0 x 1.3 x 1.7 cm,
unchanged, previously, 4.3 x 1.6 x 1.5 cm

_________________________________________________________

Estimated total number of nodules >/= 1 cm: 1

Number of spongiform nodules >/=  2 cm not described below (TR1): 0

Number of mixed cystic and solid nodules >/= 1.5 cm not described
below (TR2): 0

_________________________________________________________

Nodule # 1:

Prior biopsy: No

Location: Left; Mid

Maximum size: 0.8 cm; Other 2 dimensions: 0.5 x 0.4 cm, previously,
1.1 x 0.7 x 0.4 cm

Composition: solid/almost completely solid (2)

Echogenicity: hypoechoic (2)

Shape: not taller-than-wide (0)

Margins: ill-defined (0)

Echogenic foci: none (0)

ACR TI-RADS total points: 4.

ACR TI-RADS risk category:  TR4 (4-6 points).

Significant change in size (>/= 20% in two dimensions and minimal
increase of 2 mm): Yes nodule has decreased in size in the interval,
currently measuring 0.8 cm, previously, 1.1 cm.

Change in features: No

Change in ACR TI-RADS risk category: No

ACR TI-RADS recommendations:

Given size (<0.9 cm) and appearance, this nodule does NOT meet
TI-RADS criteria for biopsy or dedicated follow-up.

_________________________________________________________

The approximately 0.7 x 0.6 x 0.4 cm anechoic cyst within the mid,
posterior aspect the left lobe of the thyroid is unchanged compared
to the [DATE] examination previously, 0.7 cm and again does not
meet imaging criteria to recommend percutaneous sampling or
continued dedicated follow-up.
IMPRESSION: 1. Similar appearing slightly atrophic and moderately heterogeneous
appearing thyroid gland without new or enlarging thyroid nodule.
2. Nodule #1 within the left lobe of the thyroid has decreased in
size in the interval and thus no longer meets imaging criteria to
recommend continued dedicated follow-up.

The above is in keeping with the ACR TI-RADS recommendations - [HOSPITAL] 2634;[DATE].

## 2020-06-24 DIAGNOSIS — Z20822 Contact with and (suspected) exposure to covid-19: Secondary | ICD-10-CM | POA: Diagnosis not present

## 2020-06-27 DIAGNOSIS — J329 Chronic sinusitis, unspecified: Secondary | ICD-10-CM | POA: Diagnosis not present

## 2020-06-27 DIAGNOSIS — J019 Acute sinusitis, unspecified: Secondary | ICD-10-CM | POA: Diagnosis not present

## 2020-07-15 DIAGNOSIS — J343 Hypertrophy of nasal turbinates: Secondary | ICD-10-CM | POA: Diagnosis not present

## 2020-07-15 DIAGNOSIS — J342 Deviated nasal septum: Secondary | ICD-10-CM | POA: Diagnosis not present

## 2020-10-08 DIAGNOSIS — N3000 Acute cystitis without hematuria: Secondary | ICD-10-CM | POA: Diagnosis not present

## 2020-10-08 DIAGNOSIS — E559 Vitamin D deficiency, unspecified: Secondary | ICD-10-CM | POA: Diagnosis not present

## 2020-10-08 DIAGNOSIS — Z1211 Encounter for screening for malignant neoplasm of colon: Secondary | ICD-10-CM | POA: Diagnosis not present

## 2020-10-08 DIAGNOSIS — E039 Hypothyroidism, unspecified: Secondary | ICD-10-CM | POA: Diagnosis not present

## 2020-10-08 DIAGNOSIS — L908 Other atrophic disorders of skin: Secondary | ICD-10-CM | POA: Diagnosis not present

## 2020-10-08 DIAGNOSIS — Z0000 Encounter for general adult medical examination without abnormal findings: Secondary | ICD-10-CM | POA: Diagnosis not present

## 2020-10-08 DIAGNOSIS — Z23 Encounter for immunization: Secondary | ICD-10-CM | POA: Diagnosis not present

## 2020-10-08 DIAGNOSIS — M25511 Pain in right shoulder: Secondary | ICD-10-CM | POA: Diagnosis not present

## 2020-10-08 DIAGNOSIS — Z79899 Other long term (current) drug therapy: Secondary | ICD-10-CM | POA: Diagnosis not present

## 2021-03-06 DIAGNOSIS — R300 Dysuria: Secondary | ICD-10-CM | POA: Diagnosis not present

## 2021-03-06 DIAGNOSIS — N390 Urinary tract infection, site not specified: Secondary | ICD-10-CM | POA: Diagnosis not present

## 2021-04-01 DIAGNOSIS — M79605 Pain in left leg: Secondary | ICD-10-CM | POA: Diagnosis not present

## 2021-04-01 DIAGNOSIS — E039 Hypothyroidism, unspecified: Secondary | ICD-10-CM | POA: Diagnosis not present

## 2021-04-01 DIAGNOSIS — E559 Vitamin D deficiency, unspecified: Secondary | ICD-10-CM | POA: Diagnosis not present

## 2021-04-01 DIAGNOSIS — M2550 Pain in unspecified joint: Secondary | ICD-10-CM | POA: Diagnosis not present

## 2021-04-04 DIAGNOSIS — H52223 Regular astigmatism, bilateral: Secondary | ICD-10-CM | POA: Diagnosis not present

## 2021-04-11 DIAGNOSIS — M25512 Pain in left shoulder: Secondary | ICD-10-CM | POA: Diagnosis not present

## 2021-04-11 DIAGNOSIS — M25511 Pain in right shoulder: Secondary | ICD-10-CM | POA: Diagnosis not present

## 2021-04-11 DIAGNOSIS — M25552 Pain in left hip: Secondary | ICD-10-CM | POA: Diagnosis not present

## 2021-04-11 DIAGNOSIS — M25551 Pain in right hip: Secondary | ICD-10-CM | POA: Diagnosis not present

## 2021-04-15 DIAGNOSIS — M25552 Pain in left hip: Secondary | ICD-10-CM | POA: Diagnosis not present

## 2021-04-15 DIAGNOSIS — M25511 Pain in right shoulder: Secondary | ICD-10-CM | POA: Diagnosis not present

## 2021-04-15 DIAGNOSIS — R293 Abnormal posture: Secondary | ICD-10-CM | POA: Diagnosis not present

## 2021-04-15 DIAGNOSIS — M25551 Pain in right hip: Secondary | ICD-10-CM | POA: Diagnosis not present

## 2021-04-15 DIAGNOSIS — M25512 Pain in left shoulder: Secondary | ICD-10-CM | POA: Diagnosis not present

## 2021-04-15 DIAGNOSIS — M6281 Muscle weakness (generalized): Secondary | ICD-10-CM | POA: Diagnosis not present

## 2021-04-18 DIAGNOSIS — M25511 Pain in right shoulder: Secondary | ICD-10-CM | POA: Diagnosis not present

## 2021-04-18 DIAGNOSIS — M25552 Pain in left hip: Secondary | ICD-10-CM | POA: Diagnosis not present

## 2021-04-18 DIAGNOSIS — M25512 Pain in left shoulder: Secondary | ICD-10-CM | POA: Diagnosis not present

## 2021-04-18 DIAGNOSIS — R293 Abnormal posture: Secondary | ICD-10-CM | POA: Diagnosis not present

## 2021-04-18 DIAGNOSIS — M25551 Pain in right hip: Secondary | ICD-10-CM | POA: Diagnosis not present

## 2021-04-18 DIAGNOSIS — M6281 Muscle weakness (generalized): Secondary | ICD-10-CM | POA: Diagnosis not present

## 2021-04-21 DIAGNOSIS — M6281 Muscle weakness (generalized): Secondary | ICD-10-CM | POA: Diagnosis not present

## 2021-04-21 DIAGNOSIS — R293 Abnormal posture: Secondary | ICD-10-CM | POA: Diagnosis not present

## 2021-04-21 DIAGNOSIS — M25512 Pain in left shoulder: Secondary | ICD-10-CM | POA: Diagnosis not present

## 2021-04-21 DIAGNOSIS — M25552 Pain in left hip: Secondary | ICD-10-CM | POA: Diagnosis not present

## 2021-04-21 DIAGNOSIS — M25551 Pain in right hip: Secondary | ICD-10-CM | POA: Diagnosis not present

## 2021-04-21 DIAGNOSIS — M25511 Pain in right shoulder: Secondary | ICD-10-CM | POA: Diagnosis not present

## 2021-04-23 DIAGNOSIS — M25552 Pain in left hip: Secondary | ICD-10-CM | POA: Diagnosis not present

## 2021-04-23 DIAGNOSIS — R293 Abnormal posture: Secondary | ICD-10-CM | POA: Diagnosis not present

## 2021-04-23 DIAGNOSIS — M25551 Pain in right hip: Secondary | ICD-10-CM | POA: Diagnosis not present

## 2021-04-23 DIAGNOSIS — M25512 Pain in left shoulder: Secondary | ICD-10-CM | POA: Diagnosis not present

## 2021-04-23 DIAGNOSIS — M6281 Muscle weakness (generalized): Secondary | ICD-10-CM | POA: Diagnosis not present

## 2021-04-23 DIAGNOSIS — M25511 Pain in right shoulder: Secondary | ICD-10-CM | POA: Diagnosis not present

## 2021-04-28 DIAGNOSIS — M6281 Muscle weakness (generalized): Secondary | ICD-10-CM | POA: Diagnosis not present

## 2021-04-28 DIAGNOSIS — M25551 Pain in right hip: Secondary | ICD-10-CM | POA: Diagnosis not present

## 2021-04-28 DIAGNOSIS — M25552 Pain in left hip: Secondary | ICD-10-CM | POA: Diagnosis not present

## 2021-04-28 DIAGNOSIS — M25512 Pain in left shoulder: Secondary | ICD-10-CM | POA: Diagnosis not present

## 2021-04-28 DIAGNOSIS — R293 Abnormal posture: Secondary | ICD-10-CM | POA: Diagnosis not present

## 2021-04-28 DIAGNOSIS — M25511 Pain in right shoulder: Secondary | ICD-10-CM | POA: Diagnosis not present

## 2021-04-30 DIAGNOSIS — M25511 Pain in right shoulder: Secondary | ICD-10-CM | POA: Diagnosis not present

## 2021-04-30 DIAGNOSIS — M6281 Muscle weakness (generalized): Secondary | ICD-10-CM | POA: Diagnosis not present

## 2021-04-30 DIAGNOSIS — R293 Abnormal posture: Secondary | ICD-10-CM | POA: Diagnosis not present

## 2021-04-30 DIAGNOSIS — M25551 Pain in right hip: Secondary | ICD-10-CM | POA: Diagnosis not present

## 2021-04-30 DIAGNOSIS — M25512 Pain in left shoulder: Secondary | ICD-10-CM | POA: Diagnosis not present

## 2021-04-30 DIAGNOSIS — M25552 Pain in left hip: Secondary | ICD-10-CM | POA: Diagnosis not present

## 2021-05-05 DIAGNOSIS — M6281 Muscle weakness (generalized): Secondary | ICD-10-CM | POA: Diagnosis not present

## 2021-05-05 DIAGNOSIS — M25512 Pain in left shoulder: Secondary | ICD-10-CM | POA: Diagnosis not present

## 2021-05-05 DIAGNOSIS — M25511 Pain in right shoulder: Secondary | ICD-10-CM | POA: Diagnosis not present

## 2021-05-05 DIAGNOSIS — R293 Abnormal posture: Secondary | ICD-10-CM | POA: Diagnosis not present

## 2021-05-05 DIAGNOSIS — M25551 Pain in right hip: Secondary | ICD-10-CM | POA: Diagnosis not present

## 2021-05-05 DIAGNOSIS — M25552 Pain in left hip: Secondary | ICD-10-CM | POA: Diagnosis not present

## 2021-05-07 DIAGNOSIS — M25552 Pain in left hip: Secondary | ICD-10-CM | POA: Diagnosis not present

## 2021-05-07 DIAGNOSIS — M25512 Pain in left shoulder: Secondary | ICD-10-CM | POA: Diagnosis not present

## 2021-05-07 DIAGNOSIS — M25511 Pain in right shoulder: Secondary | ICD-10-CM | POA: Diagnosis not present

## 2021-05-07 DIAGNOSIS — M25551 Pain in right hip: Secondary | ICD-10-CM | POA: Diagnosis not present

## 2021-05-07 DIAGNOSIS — M6281 Muscle weakness (generalized): Secondary | ICD-10-CM | POA: Diagnosis not present

## 2021-05-07 DIAGNOSIS — R293 Abnormal posture: Secondary | ICD-10-CM | POA: Diagnosis not present

## 2021-07-25 ENCOUNTER — Ambulatory Visit: Payer: Medicare HMO | Attending: Internal Medicine

## 2021-07-25 DIAGNOSIS — Z23 Encounter for immunization: Secondary | ICD-10-CM

## 2021-07-28 ENCOUNTER — Other Ambulatory Visit (HOSPITAL_BASED_OUTPATIENT_CLINIC_OR_DEPARTMENT_OTHER): Payer: Self-pay

## 2021-07-28 MED ORDER — PFIZER COVID-19 VAC BIVALENT 30 MCG/0.3ML IM SUSP
INTRAMUSCULAR | 0 refills | Status: AC
  Filled 2021-07-28: qty 0.3, 1d supply, fill #0

## 2021-07-28 NOTE — Progress Notes (Signed)
   Covid-19 Vaccination Clinic  Name:  Shannon Sims    MRN: [id number] DOB: 06/17/1955  07/28/2021  [name] was observed post Covid-19 immunization for 15 minutes without incident. She was provided with Vaccine Information Sheet and instruction to access the V-Safe system.   [name] was instructed to call 911 with any severe reactions post vaccine: Difficulty breathing  Swelling of face and throat  A fast heartbeat  A bad rash all over body  Dizziness and weakness   Immunizations Administered     Name Date Dose VIS Date Route   Pfizer Covid-19 Vaccine Bivalent Booster 07/25/2021  2:00 PM 0.3 mL 10/09/2020 Intramuscular   Manufacturer: [employer], [employer]   Lot: [id number]   NDC: [phone]

## 2021-09-19 DIAGNOSIS — U071 COVID-19: Secondary | ICD-10-CM | POA: Diagnosis not present

## 2021-10-21 DIAGNOSIS — M7918 Myalgia, other site: Secondary | ICD-10-CM | POA: Diagnosis not present

## 2021-10-21 DIAGNOSIS — R293 Abnormal posture: Secondary | ICD-10-CM | POA: Diagnosis not present

## 2021-10-21 DIAGNOSIS — M2569 Stiffness of other specified joint, not elsewhere classified: Secondary | ICD-10-CM | POA: Diagnosis not present

## 2021-10-21 DIAGNOSIS — M6281 Muscle weakness (generalized): Secondary | ICD-10-CM | POA: Diagnosis not present

## 2021-10-21 DIAGNOSIS — M545 Low back pain, unspecified: Secondary | ICD-10-CM | POA: Diagnosis not present

## 2021-10-24 DIAGNOSIS — M2569 Stiffness of other specified joint, not elsewhere classified: Secondary | ICD-10-CM | POA: Diagnosis not present

## 2021-10-24 DIAGNOSIS — M7918 Myalgia, other site: Secondary | ICD-10-CM | POA: Diagnosis not present

## 2021-10-24 DIAGNOSIS — M545 Low back pain, unspecified: Secondary | ICD-10-CM | POA: Diagnosis not present

## 2021-10-24 DIAGNOSIS — R293 Abnormal posture: Secondary | ICD-10-CM | POA: Diagnosis not present

## 2021-10-24 DIAGNOSIS — M6281 Muscle weakness (generalized): Secondary | ICD-10-CM | POA: Diagnosis not present

## 2021-10-27 DIAGNOSIS — M545 Low back pain, unspecified: Secondary | ICD-10-CM | POA: Diagnosis not present

## 2021-10-27 DIAGNOSIS — M6281 Muscle weakness (generalized): Secondary | ICD-10-CM | POA: Diagnosis not present

## 2021-10-27 DIAGNOSIS — M7918 Myalgia, other site: Secondary | ICD-10-CM | POA: Diagnosis not present

## 2021-10-27 DIAGNOSIS — R293 Abnormal posture: Secondary | ICD-10-CM | POA: Diagnosis not present

## 2021-10-27 DIAGNOSIS — M2569 Stiffness of other specified joint, not elsewhere classified: Secondary | ICD-10-CM | POA: Diagnosis not present

## 2021-10-31 DIAGNOSIS — M545 Low back pain, unspecified: Secondary | ICD-10-CM | POA: Diagnosis not present

## 2021-10-31 DIAGNOSIS — E559 Vitamin D deficiency, unspecified: Secondary | ICD-10-CM | POA: Diagnosis not present

## 2021-10-31 DIAGNOSIS — M7918 Myalgia, other site: Secondary | ICD-10-CM | POA: Diagnosis not present

## 2021-10-31 DIAGNOSIS — M2569 Stiffness of other specified joint, not elsewhere classified: Secondary | ICD-10-CM | POA: Diagnosis not present

## 2021-10-31 DIAGNOSIS — M6281 Muscle weakness (generalized): Secondary | ICD-10-CM | POA: Diagnosis not present

## 2021-10-31 DIAGNOSIS — E039 Hypothyroidism, unspecified: Secondary | ICD-10-CM | POA: Diagnosis not present

## 2021-10-31 DIAGNOSIS — R293 Abnormal posture: Secondary | ICD-10-CM | POA: Diagnosis not present

## 2021-10-31 DIAGNOSIS — Z79899 Other long term (current) drug therapy: Secondary | ICD-10-CM | POA: Diagnosis not present

## 2021-11-05 DIAGNOSIS — Z79899 Other long term (current) drug therapy: Secondary | ICD-10-CM | POA: Diagnosis not present

## 2021-11-05 DIAGNOSIS — Z1211 Encounter for screening for malignant neoplasm of colon: Secondary | ICD-10-CM | POA: Diagnosis not present

## 2021-11-05 DIAGNOSIS — Z0000 Encounter for general adult medical examination without abnormal findings: Secondary | ICD-10-CM | POA: Diagnosis not present

## 2021-11-05 DIAGNOSIS — R69 Illness, unspecified: Secondary | ICD-10-CM | POA: Diagnosis not present

## 2021-11-05 DIAGNOSIS — E559 Vitamin D deficiency, unspecified: Secondary | ICD-10-CM | POA: Diagnosis not present

## 2021-11-05 DIAGNOSIS — H6121 Impacted cerumen, right ear: Secondary | ICD-10-CM | POA: Diagnosis not present

## 2021-11-05 DIAGNOSIS — E039 Hypothyroidism, unspecified: Secondary | ICD-10-CM | POA: Diagnosis not present

## 2021-11-05 DIAGNOSIS — E041 Nontoxic single thyroid nodule: Secondary | ICD-10-CM | POA: Diagnosis not present

## 2021-11-10 DIAGNOSIS — M6281 Muscle weakness (generalized): Secondary | ICD-10-CM | POA: Diagnosis not present

## 2021-11-10 DIAGNOSIS — M7918 Myalgia, other site: Secondary | ICD-10-CM | POA: Diagnosis not present

## 2021-11-10 DIAGNOSIS — M2569 Stiffness of other specified joint, not elsewhere classified: Secondary | ICD-10-CM | POA: Diagnosis not present

## 2021-11-10 DIAGNOSIS — R293 Abnormal posture: Secondary | ICD-10-CM | POA: Diagnosis not present

## 2021-11-10 DIAGNOSIS — M545 Low back pain, unspecified: Secondary | ICD-10-CM | POA: Diagnosis not present

## 2021-11-14 DIAGNOSIS — Z23 Encounter for immunization: Secondary | ICD-10-CM | POA: Diagnosis not present

## 2022-03-12 ENCOUNTER — Other Ambulatory Visit (HOSPITAL_BASED_OUTPATIENT_CLINIC_OR_DEPARTMENT_OTHER): Payer: Self-pay

## 2022-03-12 MED ORDER — COMIRNATY 30 MCG/0.3ML IM SUSY
PREFILLED_SYRINGE | INTRAMUSCULAR | 0 refills | Status: AC
  Filled 2022-03-12: qty 0.3, 1d supply, fill #0

## 2022-03-13 ENCOUNTER — Other Ambulatory Visit (HOSPITAL_BASED_OUTPATIENT_CLINIC_OR_DEPARTMENT_OTHER): Payer: Self-pay

## 2022-05-16 DIAGNOSIS — N3001 Acute cystitis with hematuria: Secondary | ICD-10-CM | POA: Diagnosis not present

## 2022-05-16 DIAGNOSIS — N390 Urinary tract infection, site not specified: Secondary | ICD-10-CM | POA: Diagnosis not present

## 2022-05-16 DIAGNOSIS — R300 Dysuria: Secondary | ICD-10-CM | POA: Diagnosis not present

## 2022-07-25 ENCOUNTER — Ambulatory Visit
Admission: RE | Admit: 2022-07-25 | Discharge: 2022-07-25 | Disposition: A | Discharge status: Home / Self Care | Payer: Medicare HMO | Source: Ambulatory Visit | Attending: Nurse Practitioner | Admitting: Nurse Practitioner

## 2022-07-25 VITALS — BP 129/86 | HR 96 | Temp 99.0°F | Resp 16

## 2022-07-25 DIAGNOSIS — J029 Acute pharyngitis, unspecified: Secondary | ICD-10-CM | POA: Diagnosis not present

## 2022-07-25 DIAGNOSIS — Z20828 Contact with and (suspected) exposure to other viral communicable diseases: Secondary | ICD-10-CM

## 2022-07-25 LAB — POCT RAPID STREP A (OFFICE): Rapid Strep A Screen: NEGATIVE

## 2022-07-25 LAB — POCT MONO SCREEN (KUC): Mono, POC: NEGATIVE

## 2022-07-25 NOTE — ED Provider Notes (Signed)
[facility]    CSN: [id number] Arrival date & time: 07/25/22  1322      History   Chief Complaint Chief Complaint  Patient presents with   Sore Throat    HPI Shannon Sims is a 67 y.o. female presents for evaluation of a sore throat.  Patient reports intermittent sore throat that occurs daily over the past month.  She is also had some congestion with that and fatigue but denies cough, fevers, nausea/vomiting/diarrhea, ear pain, shortness of breath.  She does states she has allergies but does not take any current allergy medication.  States she also has a history of acid reflux.  Her daughter currently has mono and she is concerned it might be causing her symptoms.  She has not taken any over-the-counter medications to try to treat her symptoms.  No other concerns at this time.   Sore Throat    Past Medical History:  Diagnosis Date   Hypothyroid     There are no problems to display for this patient.   History reviewed. No pertinent surgical history.  OB History   No obstetric history on file.      Home Medications    Prior to Admission medications   Medication Sig Start Date End Date Taking? Authorizing Provider  levothyroxine (SYNTHROID) 50 MCG tablet Take 50 mcg by mouth daily. 07/13/22  Yes [provider]  COVID-19 mRNA bivalent vaccine, Pfizer, (PFIZER COVID-19 VAC BIVALENT) injection Inject into the muscle. 07/28/21   Judyann Munson, MD  COVID-19 mRNA vaccine [id number] (COMIRNATY) syringe Inject into the muscle. 03/12/22       Family History History reviewed. No pertinent family history.  Social History Social History   Tobacco Use   Smoking status: Never   Smokeless tobacco: Never  Substance Use Topics   Drug use: Never     Allergies   Patient has no known allergies.   Review of Systems Review of Systems  Constitutional:  Positive for fatigue.  HENT:  Positive for congestion and sore throat.      Physical Exam Triage Vital  Signs ED Triage Vitals  Enc Vitals Group     BP 07/25/22 1332 129/86     Pulse Rate 07/25/22 1332 96     Resp 07/25/22 1332 16     Temp 07/25/22 1332 99 F (37.2 C)     Temp Source 07/25/22 1332 Oral     SpO2 07/25/22 1332 94 %     Weight --      Height --      Head Circumference --      Peak Flow --      Pain Score 07/25/22 1335 1     Pain Loc --      Pain Edu? --      Excl. in GC? --    No data found.  Updated Vital Signs BP 129/86 (BP Location: Right Arm)   Pulse 96   Temp 99 F (37.2 C) (Oral)   Resp 16   SpO2 94%   Visual Acuity Right Eye Distance:   Left Eye Distance:   Bilateral Distance:    Right Eye Near:   Left Eye Near:    Bilateral Near:     Physical Exam Vitals and nursing note reviewed.  Constitutional:      General: She is not in acute distress.    Appearance: She is well-developed. She is not ill-appearing.  HENT:     Head: Normocephalic and atraumatic.  Right Ear: Tympanic membrane and ear canal normal.     Left Ear: Tympanic membrane and ear canal normal.     Nose: Congestion present.     Mouth/Throat:     Mouth: Mucous membranes are moist.     Pharynx: Oropharynx is clear. Uvula midline. Posterior oropharyngeal erythema present.     Tonsils: No tonsillar exudate or tonsillar abscesses.  Eyes:     Conjunctiva/sclera: Conjunctivae normal.     Pupils: Pupils are equal, round, and reactive to light.  Cardiovascular:     Rate and Rhythm: Normal rate and regular rhythm.     Heart sounds: Normal heart sounds.  Pulmonary:     Effort: Pulmonary effort is normal.     Breath sounds: Normal breath sounds.  Musculoskeletal:     Cervical back: Normal range of motion and neck supple.  Lymphadenopathy:     Cervical: No cervical adenopathy.  Skin:    General: Skin is warm and dry.  Neurological:     General: No focal deficit present.     Mental Status: She is alert and oriented to person, place, and time.  Psychiatric:        Mood and  Affect: Mood normal.        Behavior: Behavior normal.      UC Treatments / Results  Labs (all labs ordered are listed, but only abnormal results are displayed) Labs Reviewed  POCT RAPID STREP A (OFFICE)  POCT MONO SCREEN ([facility])    EKG   Radiology No results found.  Procedures Procedures (including critical care time)  Medications Ordered in UC Medications - No data to display  Initial Impression / Assessment and Plan / UC Course  I have reviewed the triage vital signs and the nursing notes.  Pertinent labs & imaging results that were available during my care of the patient were reviewed by me and considered in my medical decision making (see chart for details).     Negative rapid strep and negative mono.  Discussed with patient likely allergy induced symptoms.  Advised Flonase and cetirizine.  Patient states she cannot take any over-the-counter allergy medicine as it makes her too drowsy.  She also states she has used Flonase in the past and did not tolerate but cannot recall why.  I advise she retry Flonase and follow-up with her PCP for further workup of her symptoms and possible ENT referral if indicated.  Patient verbalized understanding ER precautions reviewed Final Clinical Impressions(s) / UC Diagnoses   Final diagnoses:  Exposure to mononucleosis syndrome  Sore throat  Acute pharyngitis, unspecified etiology     Discharge Instructions      Please take flu days daily and follow-up with your PCP for further workup and/or ENT evaluation if indicated     ED Prescriptions   None    PDMP not reviewed this encounter.   Radford Pax, NP 07/25/22 1407

## 2022-07-25 NOTE — ED Triage Notes (Signed)
Pt presents to UC w/ c/o sore throat, fatigue x1 month on and off. Unsure if exposed to mono.  Denies fever.

## 2022-07-25 NOTE — Discharge Instructions (Addendum)
Please take flu days daily and follow-up with your PCP for further workup and/or ENT evaluation if indicated

## 2022-08-12 DIAGNOSIS — L821 Other seborrheic keratosis: Secondary | ICD-10-CM | POA: Diagnosis not present

## 2022-08-12 DIAGNOSIS — L814 Other melanin hyperpigmentation: Secondary | ICD-10-CM | POA: Diagnosis not present

## 2022-08-12 DIAGNOSIS — D225 Melanocytic nevi of trunk: Secondary | ICD-10-CM | POA: Diagnosis not present

## 2022-08-15 DIAGNOSIS — H918X3 Other specified hearing loss, bilateral: Secondary | ICD-10-CM | POA: Diagnosis not present

## 2022-08-24 DIAGNOSIS — J029 Acute pharyngitis, unspecified: Secondary | ICD-10-CM | POA: Diagnosis not present

## 2022-08-24 DIAGNOSIS — H6980 Other specified disorders of Eustachian tube, unspecified ear: Secondary | ICD-10-CM | POA: Diagnosis not present

## 2022-08-24 DIAGNOSIS — F325 Major depressive disorder, single episode, in full remission: Secondary | ICD-10-CM | POA: Diagnosis not present

## 2022-10-06 DIAGNOSIS — R69 Illness, unspecified: Secondary | ICD-10-CM | POA: Diagnosis not present

## 2022-10-23 ENCOUNTER — Other Ambulatory Visit (HOSPITAL_BASED_OUTPATIENT_CLINIC_OR_DEPARTMENT_OTHER): Payer: Self-pay

## 2022-10-23 MED ORDER — COMIRNATY 30 MCG/0.3ML IM SUSY
0.3000 mL | PREFILLED_SYRINGE | Freq: Once | INTRAMUSCULAR | 0 refills | Status: AC
  Filled 2022-10-23: qty 0.3, 1d supply, fill #0

## 2022-11-06 ENCOUNTER — Other Ambulatory Visit: Payer: Self-pay | Admitting: Family Medicine

## 2022-11-06 DIAGNOSIS — Z1231 Encounter for screening mammogram for malignant neoplasm of breast: Secondary | ICD-10-CM

## 2022-11-09 ENCOUNTER — Ambulatory Visit: Payer: Medicare HMO

## 2022-11-13 DIAGNOSIS — E039 Hypothyroidism, unspecified: Secondary | ICD-10-CM | POA: Diagnosis not present

## 2022-11-13 DIAGNOSIS — Z79899 Other long term (current) drug therapy: Secondary | ICD-10-CM | POA: Diagnosis not present

## 2022-11-13 DIAGNOSIS — E559 Vitamin D deficiency, unspecified: Secondary | ICD-10-CM | POA: Diagnosis not present

## 2022-12-03 ENCOUNTER — Ambulatory Visit
Admission: RE | Admit: 2022-12-03 | Discharge: 2022-12-03 | Disposition: A | Discharge status: Home / Self Care | Payer: Medicare HMO | Source: Ambulatory Visit | Attending: Family Medicine | Admitting: Family Medicine

## 2022-12-03 DIAGNOSIS — Z1231 Encounter for screening mammogram for malignant neoplasm of breast: Secondary | ICD-10-CM

## 2022-12-08 ENCOUNTER — Other Ambulatory Visit: Payer: Self-pay | Admitting: Family Medicine

## 2022-12-08 DIAGNOSIS — R928 Other abnormal and inconclusive findings on diagnostic imaging of breast: Secondary | ICD-10-CM

## 2022-12-29 ENCOUNTER — Encounter: Payer: Self-pay | Admitting: Family Medicine

## 2022-12-31 ENCOUNTER — Ambulatory Visit
Admission: RE | Admit: 2022-12-31 | Discharge: 2022-12-31 | Disposition: A | Discharge status: Home / Self Care | Payer: Medicare HMO | Source: Ambulatory Visit | Attending: Family Medicine | Admitting: Family Medicine

## 2022-12-31 DIAGNOSIS — R928 Other abnormal and inconclusive findings on diagnostic imaging of breast: Secondary | ICD-10-CM

## 2022-12-31 DIAGNOSIS — N6323 Unspecified lump in the left breast, lower outer quadrant: Secondary | ICD-10-CM | POA: Diagnosis not present

## 2022-12-31 DIAGNOSIS — N6312 Unspecified lump in the right breast, upper inner quadrant: Secondary | ICD-10-CM | POA: Diagnosis not present

## 2023-11-09 DIAGNOSIS — Z1212 Encounter for screening for malignant neoplasm of rectum: Secondary | ICD-10-CM | POA: Diagnosis not present

## 2023-11-09 DIAGNOSIS — Z1211 Encounter for screening for malignant neoplasm of colon: Secondary | ICD-10-CM | POA: Diagnosis not present

## 2023-11-23 DIAGNOSIS — R000 Tachycardia, unspecified: Secondary | ICD-10-CM | POA: Diagnosis not present

## 2023-11-23 DIAGNOSIS — Z0000 Encounter for general adult medical examination without abnormal findings: Secondary | ICD-10-CM | POA: Diagnosis not present

## 2023-11-23 DIAGNOSIS — E039 Hypothyroidism, unspecified: Secondary | ICD-10-CM | POA: Diagnosis not present

## 2023-11-23 DIAGNOSIS — F330 Major depressive disorder, recurrent, mild: Secondary | ICD-10-CM | POA: Diagnosis not present
